# Patient Record
Sex: Male | Born: 1970 | Race: White | Hispanic: No | Marital: Married | Smoking: Never smoker
Health system: Southern US, Community
[De-identification: ages and names within clinical notes are randomized; demographics above are authoritative.]

## PROBLEM LIST (undated history)

## (undated) DIAGNOSIS — I10 Essential (primary) hypertension: Secondary | ICD-10-CM

## (undated) DIAGNOSIS — R519 Headache, unspecified: Secondary | ICD-10-CM

## (undated) DIAGNOSIS — E7800 Pure hypercholesterolemia, unspecified: Secondary | ICD-10-CM

## (undated) DIAGNOSIS — Z87442 Personal history of urinary calculi: Secondary | ICD-10-CM

## (undated) DIAGNOSIS — G4730 Sleep apnea, unspecified: Secondary | ICD-10-CM

---

## 2003-10-07 HISTORY — PX: EXTRACORPOREAL SHOCK WAVE LITHOTRIPSY: SHX1557

## 2016-10-06 HISTORY — PX: OTHER SURGICAL HISTORY: SHX169

## 2016-10-15 DIAGNOSIS — M5092 Unspecified cervical disc disorder, mid-cervical region, unspecified level: Secondary | ICD-10-CM | POA: Insufficient documentation

## 2016-10-15 DIAGNOSIS — M542 Cervicalgia: Secondary | ICD-10-CM | POA: Insufficient documentation

## 2016-12-17 DIAGNOSIS — E785 Hyperlipidemia, unspecified: Secondary | ICD-10-CM | POA: Insufficient documentation

## 2016-12-17 DIAGNOSIS — I10 Essential (primary) hypertension: Secondary | ICD-10-CM | POA: Insufficient documentation

## 2016-12-17 DIAGNOSIS — G4730 Sleep apnea, unspecified: Secondary | ICD-10-CM | POA: Insufficient documentation

## 2016-12-17 DIAGNOSIS — R0683 Snoring: Secondary | ICD-10-CM | POA: Insufficient documentation

## 2016-12-17 DIAGNOSIS — J309 Allergic rhinitis, unspecified: Secondary | ICD-10-CM | POA: Insufficient documentation

## 2017-02-04 DIAGNOSIS — R002 Palpitations: Secondary | ICD-10-CM | POA: Insufficient documentation

## 2017-03-05 DIAGNOSIS — G44209 Tension-type headache, unspecified, not intractable: Secondary | ICD-10-CM | POA: Insufficient documentation

## 2017-03-06 DIAGNOSIS — S22060S Wedge compression fracture of T7-T8 vertebra, sequela: Secondary | ICD-10-CM | POA: Insufficient documentation

## 2017-03-06 DIAGNOSIS — Z87442 Personal history of urinary calculi: Secondary | ICD-10-CM | POA: Insufficient documentation

## 2018-05-11 DIAGNOSIS — N529 Male erectile dysfunction, unspecified: Secondary | ICD-10-CM | POA: Insufficient documentation

## 2018-05-11 DIAGNOSIS — F321 Major depressive disorder, single episode, moderate: Secondary | ICD-10-CM | POA: Insufficient documentation

## 2018-05-11 DIAGNOSIS — Z87820 Personal history of traumatic brain injury: Secondary | ICD-10-CM | POA: Insufficient documentation

## 2020-03-16 ENCOUNTER — Emergency Department (HOSPITAL_COMMUNITY)

## 2020-03-16 ENCOUNTER — Emergency Department (HOSPITAL_COMMUNITY)
Admission: EM | Admit: 2020-03-16 | Discharge: 2020-03-16 | Disposition: A | Discharge status: Home / Self Care | Attending: Emergency Medicine | Admitting: Emergency Medicine

## 2020-03-16 ENCOUNTER — Encounter (HOSPITAL_COMMUNITY): Payer: Self-pay | Admitting: Emergency Medicine

## 2020-03-16 DIAGNOSIS — R1032 Left lower quadrant pain: Secondary | ICD-10-CM | POA: Diagnosis present

## 2020-03-16 DIAGNOSIS — I10 Essential (primary) hypertension: Secondary | ICD-10-CM | POA: Insufficient documentation

## 2020-03-16 DIAGNOSIS — N200 Calculus of kidney: Secondary | ICD-10-CM | POA: Diagnosis not present

## 2020-03-16 LAB — BASIC METABOLIC PANEL
Anion gap: 16 — ABNORMAL HIGH (ref 5–15)
BUN: 20 mg/dL (ref 6–20)
CO2: 19 mmol/L — ABNORMAL LOW (ref 22–32)
Calcium: 9.4 mg/dL (ref 8.9–10.3)
Chloride: 104 mmol/L (ref 98–111)
Creatinine, Ser: 1.18 mg/dL (ref 0.61–1.24)
GFR calc Af Amer: >60 mL/min (ref >60)
GFR calc non Af Amer: >60 mL/min (ref >60)
Glucose, Bld: 148 mg/dL — ABNORMAL HIGH (ref 70–99)
Potassium: 4.1 mmol/L (ref 3.5–5.1)
Sodium: 139 mmol/L (ref 135–145)

## 2020-03-16 LAB — URINALYSIS, ROUTINE W REFLEX MICROSCOPIC
Bacteria, UA: NONE SEEN
Bilirubin Urine: NEGATIVE
Glucose, UA: NEGATIVE mg/dL
Ketones, ur: 80 mg/dL — AB
Leukocytes,Ua: NEGATIVE
Nitrite: NEGATIVE
Protein, ur: 30 mg/dL — AB
Specific Gravity, Urine: 1.025 (ref 1.005–1.030)
pH: 5 (ref 5.0–8.0)

## 2020-03-16 LAB — CBC
HCT: 47.6 % (ref 39.0–52.0)
Hemoglobin: 15.6 g/dL (ref 13.0–17.0)
MCH: 30.5 pg (ref 26.0–34.0)
MCHC: 32.8 g/dL (ref 30.0–36.0)
MCV: 93 fL (ref 80.0–100.0)
Platelets: 242 10*3/uL (ref 150–400)
RBC: 5.12 MIL/uL (ref 4.22–5.81)
RDW: 12.7 % (ref 11.5–15.5)
WBC: 11.3 10*3/uL — ABNORMAL HIGH (ref 4.0–10.5)
nRBC: 0 % (ref 0.0–0.2)

## 2020-03-16 MED ORDER — KETOROLAC TROMETHAMINE 30 MG/ML IJ SOLN
30.0000 mg | Freq: Once | INTRAMUSCULAR | Status: AC
  Administered 2020-03-16: 30 mg via INTRAVENOUS
  Filled 2020-03-16: qty 1

## 2020-03-16 MED ORDER — HYDROCODONE-ACETAMINOPHEN 5-325 MG PO TABS: 2.0000 | ORAL_TABLET | ORAL | 0 refills | Status: AC | PRN

## 2020-03-16 NOTE — ED Provider Notes (Signed)
Patient received at shift change from [facility].  Please see his note for full HPI.  In short, patient presented with left-sided flank pain and frontal abdominal pain with a history of kidney stones.  UA with large blood, and mild leukocytosis of 11.3.  No lab abnormalities.  CT with 9 x 6 stone in the left kidney in the lower pole, with developing staghorn calculus with infundibular stricture, slough palpable in the setting of prior papillary necrosis or small neoplasm with calcification.  Consulted urology Dr. Diona Fanti, with whom I went over the imaging.  He recommends discharge with pain medication and follow-up in office.  On reevaluation of the patient, he is resting comfortably in the bed, and does not endorse any pain at this time.  Will discharge with short course of pain medication, and have him follow-up with Dr. Diona Fanti in office on Monday.  Return precautions given.  All the patient's questions have been answered to his satisfaction, he voices understanding and is agreeable to this plan.  At this stage in the ED course, the patient has been medically screened and is stable for discharge.  Physical Exam  BP 129/72 (BP Location: Right Arm)   Pulse 85   Temp (!) 97.4 F (36.3 C)   Resp 16   Ht 5\' 8"  (1.727 m)   Wt 95.3 kg   SpO2 98%   BMI 31.93 kg/m   Physical Exam Vitals reviewed.  Constitutional:      General: He is not in acute distress.    Appearance: Normal appearance. He is not ill-appearing, toxic-appearing or diaphoretic.  HENT:     Head: Normocephalic and atraumatic.     Mouth/Throat:     Mouth: Mucous membranes are moist.     Pharynx: Oropharynx is clear.  Eyes:     General:        Right eye: No discharge.        Left eye: No discharge.     Extraocular Movements: Extraocular movements intact.     Conjunctiva/sclera: Conjunctivae normal.  Cardiovascular:     Rate and Rhythm: Normal rate and regular rhythm.  Pulmonary:     Effort: Pulmonary effort is normal.      Breath sounds: Normal breath sounds.  Abdominal:     General: Abdomen is flat. There is no distension.     Tenderness: There is abdominal tenderness (Mild left lower quadrant tenderness). There is left CVA tenderness (Mild). There is no right CVA tenderness.  Musculoskeletal:        General: No swelling. Normal range of motion.     Cervical back: Normal range of motion.  Skin:    General: Skin is warm and dry.  Neurological:     General: No focal deficit present.     Mental Status: He is alert and oriented to person, place, and time.  Psychiatric:        Mood and Affect: Mood normal.        Behavior: Behavior normal.     ED Course/Procedures   Clinical Course as of Mar 16 1717  Fri Mar 16, 2020  1645 Hgb urine dipstick(!): LARGE [GG]    Clinical Course User Index [GG] Corena Herter, PA-C    Procedures  MDM        Garald Balding, PA-C 03/16/20 1718    Lajean Saver, MD 03/18/20 [phone]

## 2020-03-16 NOTE — Discharge Instructions (Addendum)
Please call Dr. Diona Fanti with [facility] on Monday to schedule an appointment.  Please take pain medication as needed for pain as prescribed.   Return to the ED or seek immediate medical attention should you experience any new or worsening symptoms.

## 2020-03-16 NOTE — ED Triage Notes (Signed)
Per pt, states left abdominal pain to flank area-started this am-states history of kidney stones-dysuria, nausea

## 2020-03-16 NOTE — ED Provider Notes (Signed)
[name] DEPT Provider Note   CSN: [id number] Arrival date & time: 03/16/20  1142     History Chief Complaint  Patient presents with  . Flank Pain    Donald Lang is a 49 y.o. male with PMH of HTN, migraine headaches, and kidney stones who presents to the ED with acute onset left-sided flank/abdominal pain that began this morning at approximately 8:30 AM.  Patient reports that he was having a normal morning until he had sudden onset of pain symptoms that progressed to 10 out of 10 discomfort.  While in triage, patient was in significant pain, however his symptoms seem to spontaneously abate.  On my examination, patient is resting comfortably in no acute distress.  He states that while the sudden onset of symptoms feels comparable to his prior kidney stones, his symptoms are more anterior and abdomen then in his flank as usual.  His symptoms this morning were accompanied by nausea and he states that he felt as though he needed to pass stool but could not.  He denies any recent illness, fevers or chills, chest pain or difficulty breathing, cough, vomiting, diminished appetite, hematuria, dysuria, or other symptoms.  HPI     Past Medical History:  Diagnosis Date  . Renal disorder     There are no problems to display for this patient.     No family history on file.  Social History   Tobacco Use  . Smoking status: Not on file  Substance Use Topics  . Alcohol use: Not on file  . Drug use: Not on file    Home Medications Prior to Admission medications   Medication Sig Start Date End Date Taking? Authorizing Provider  rizatriptan (MAXALT-MLT) 10 MG disintegrating tablet Take 10 mg by mouth daily as needed for migraine. May repeat in 2 hours if needed   Yes [provider]    Allergies    Patient has no known allergies.  Review of Systems   Review of Systems  All other systems reviewed and are negative.   Physical  Exam Updated Vital Signs BP 129/72 (BP Location: Right Arm)   Pulse 85   Temp (!) 97.4 F (36.3 C)   Resp 16   Ht 5\' 8"  (1.727 m)   Wt 95.3 kg   SpO2 98%   BMI 31.93 kg/m   Physical Exam Vitals and nursing note reviewed. Exam conducted with a chaperone present.  Constitutional:      Appearance: Normal appearance.  HENT:     Head: Normocephalic and atraumatic.  Eyes:     General: No scleral icterus.    Conjunctiva/sclera: Conjunctivae normal.  Cardiovascular:     Rate and Rhythm: Normal rate and regular rhythm.     Pulses: Normal pulses.     Heart sounds: Normal heart sounds.  Pulmonary:     Effort: Pulmonary effort is normal. No respiratory distress.     Breath sounds: Normal breath sounds.  Abdominal:     Comments: Soft, nondistended.  Mild TTP in area of LLQ, with no TTP elsewhere.  No suprapubic tenderness.  No left-sided CVAT.  No overlying skin changes.  Skin:    General: Skin is dry.     Capillary Refill: Capillary refill takes less than 2 seconds.  Neurological:     Mental Status: He is alert and oriented to person, place, and time.     GCS: GCS eye subscore is 4. GCS verbal subscore is 5. GCS motor subscore is  6.  Psychiatric:        Mood and Affect: Mood normal.        Behavior: Behavior normal.        Thought Content: Thought content normal.     ED Results / Procedures / Treatments   Labs (all labs ordered are listed, but only abnormal results are displayed) Labs Reviewed  URINALYSIS, ROUTINE W REFLEX MICROSCOPIC - Abnormal; Notable for the following components:      Result Value   Hgb urine dipstick LARGE (*)    Ketones, ur 80 (*)    Protein, ur 30 (*)    All other components within normal limits  BASIC METABOLIC PANEL - Abnormal; Notable for the following components:   CO2 19 (*)    Glucose, Bld 148 (*)    Anion gap 16 (*)    All other components within normal limits  CBC - Abnormal; Notable for the following components:   WBC 11.3 (*)    All  other components within normal limits    EKG None  Radiology CT Renal Stone Study  Result Date: 03/16/2020 CLINICAL DATA:  Flank pain, kidney stone suspected, LEFT lower quadrant discomfort EXAM: CT ABDOMEN AND PELVIS WITHOUT CONTRAST TECHNIQUE: Multidetector CT imaging of the abdomen and pelvis was performed following the standard protocol without IV contrast. COMPARISON:  None. FINDINGS: Lower chest: Calcification in the lingula compatible with granulomatous disease. No consolidation. No pleural effusion. Also with calcified granuloma in the LEFT lower lobe. Hepatobiliary: Hepatic steatosis. No suspicious focal hepatic lesion. No pericholecystic stranding. Pancreas: No peripancreatic stranding or ductal dilation. Spleen: Spleen normal size and contour. Adrenals/Urinary Tract: Adrenal glands are normal. Calcification on the LEFT in the lower pole infundibulum measuring 9 x 6 mm (image 39 of series 2. Question of focal lower pole caliceal dilation. Pattern of calcification is pyramidal and with mixed density. No ureteral calculus. Smooth renal contours. No hydronephrosis on the RIGHT. Urinary bladder is normal. Stomach/Bowel: No acute small bowel process. Stomach grossly normal. Appendix is normal. Foreshortening of the LEFT hemicolon, this raises the question of prior partial colectomy. No acute process related to the colon which is partially stool filled. Vascular/Lymphatic: Minimal atherosclerotic calcification. No aneurysmal dilation. No adenopathy in the upper abdomen. No pelvic lymphadenopathy. Reproductive: Prostate grossly normal by CT. Urinary bladder again unremarkable. Other: No free air.  No ascites. Musculoskeletal: No acute or destructive bone process. IMPRESSION: 1. Calcification on the LEFT in the lower pole infundibulum measuring 9 x 6 mm. Question of focal lower pole caliceal dilation. Pattern of calcification is pyramidal and with mixed density. Developing staghorn calculus with  infundibular stricture, calcified sloughed papilla in the setting of prior papillary necrosis or small urothelial neoplasm with calcification all considered as differential consideration given the isolated dilation of lower pole calyx and the pattern of calcification. Follow-up CT with hematuria protocol is suggested for further evaluation. 2. No ureteral calculus or hydronephrosis on the RIGHT. 3. Hepatic steatosis. 4. Foreshortening of the LEFT hemicolon. More medial course than usually seen this raises the question of prior partial colectomy. 5. Aortic atherosclerosis. Aortic Atherosclerosis (ICD10-I70.0). Electronically Signed   By: Zetta Bills M.D.   On: 03/16/2020 16:45    Procedures Procedures (including critical care time)  Medications Ordered in ED Medications  ketorolac (TORADOL) 30 MG/ML injection 30 mg (30 mg Intravenous Given 03/16/20 1424)    ED Course  I have reviewed the triage vital signs and the nursing notes.  Pertinent labs & imaging results  that were available during my care of the patient were reviewed by me and considered in my medical decision making (see chart for details).  Clinical Course as of Mar 17 1655  Fri Mar 16, 2020  1645 Hgb urine dipstick(!): LARGE [GG]    Clinical Course User Index [GG] Corena Herter, PA-C   MDM Rules/Calculators/A&P                          Given that patient was 10 on 10 pain triage is not resting comfortably on exam, it is possible that he has spontaneously passed a stone.  Will wait for labs to return prior to obtaining any imaging.  His abdominal exam was relatively benign and he does not appear to be in any acute distress.  Patient does inform me that he has been renovating his basement which has been physically demanding, however given his acute onset of symptoms while at rest, I have lower suspicion for musculoskeletal etiology as cause of his acute onset LLQ/flank discomfort.  Patient CBC is notable for mild  leukocytosis 11.3 and his BMP reflects a mildly elevated gap anion acidosis.  UA is notable for large hemoglobin, no evidence of infection.  Given this finding in conjunction with his HPI history of obstructing kidney stones, will obtain CT renal stone study.    On subsequent evaluation, patient is resting comfortably and is in no acute distress.  CT Renal Stone pending.  At shift change care was transferred to Sharyn Lull, PA-C who will follow pending studies, re-evaluate, and determine disposition.     Final Clinical Impression(s) / ED Diagnoses Final diagnoses:  Left flank pain    Rx / DC Orders ED Discharge Orders    None       Corena Herter, PA-C 03/16/20 1656    Lucrezia Starch, MD 03/31/20 [phone]

## 2020-03-28 ENCOUNTER — Other Ambulatory Visit: Payer: Self-pay | Admitting: Urology

## 2020-03-28 ENCOUNTER — Other Ambulatory Visit (HOSPITAL_COMMUNITY)

## 2020-04-05 ENCOUNTER — Encounter (HOSPITAL_BASED_OUTPATIENT_CLINIC_OR_DEPARTMENT_OTHER): Payer: Self-pay | Admitting: Urology

## 2020-04-05 ENCOUNTER — Other Ambulatory Visit: Payer: Self-pay

## 2020-04-05 NOTE — Progress Notes (Signed)
NEW Covid Policy July 0086  Surgery Day:  04-11-2020  Facility:  [facility]  Type of Surgery: left ureteroscopy  Fully Covid Vaccinated:   1) 01-04-2020                                          2)02-01-2020                                          Where? [facility] [place]                                           Type? [facility]   Do you have symptoms? no  In the past 14 days:no        Have you had any symptoms? no       Have you been tested covid positive?no       Have you been in contact with someone covid positive?no        Is pt Immuno-compromised? No Patient instructed to bring covid vaccination card day of surgery

## 2020-04-05 NOTE — Progress Notes (Signed)
Spoke w/ via phone for pre-op interview---pt Lab needs dos---- I stat 8, ekg  Patient states has htn no current meds bp runs 140/90  Per pt             COVID test ------not needed fully vaccinated Arrive at -------630 am 04-11-2020 No food after midnight clear liquids from midnight until 530 am then npo Medications to take morning of surgery -----none Diabetic medication -----n/a Patient Special Instructions -----bring cpap mask tubing and machine and leave in car Pre-Op special Istructions -----none Patient verbalized understanding of instructions that were given at this phone interview. Patient denies shortness of breath, chest pain, fever, cough a this phone interview.

## 2020-04-10 ENCOUNTER — Other Ambulatory Visit (HOSPITAL_COMMUNITY): Attending: Urology

## 2020-04-10 NOTE — Anesthesia Preprocedure Evaluation (Addendum)
Anesthesia Evaluation  Patient identified by MRN, date of birth, ID band Patient awake    Reviewed: Allergy & Precautions, NPO status , Patient's Chart, lab work & pertinent test results  History of Anesthesia Complications Negative for: history of anesthetic complications  Airway Mallampati: II  TM Distance: >3 FB Neck ROM: Full    Dental  (+) Dental Advisory Given, Teeth Intact   Pulmonary sleep apnea and Continuous Positive Airway Pressure Ventilation ,    Pulmonary exam normal        Cardiovascular hypertension (no meds), Normal cardiovascular exam     Neuro/Psych  Headaches, negative psych ROS   GI/Hepatic negative GI ROS, Neg liver ROS,   Endo/Other   Obesity   Renal/GU negative Renal ROS     Musculoskeletal negative musculoskeletal ROS (+)   Abdominal   Peds  Hematology negative hematology ROS (+)   Anesthesia Other Findings Fully vaccinated for Covid, not tested preop   Reproductive/Obstetrics                           Anesthesia Physical Anesthesia Plan  ASA: II  Anesthesia Plan: General   Post-op Pain Management:    Induction: Intravenous  PONV Risk Score and Plan: 2 and Treatment may vary due to age or medical condition, Ondansetron, Dexamethasone and Midazolam  Airway Management Planned: LMA  Additional Equipment: None  Intra-op Plan:   Post-operative Plan: Extubation in OR  Informed Consent: I have reviewed the patients History and Physical, chart, labs and discussed the procedure including the risks, benefits and alternatives for the proposed anesthesia with the patient or authorized representative who has indicated his/her understanding and acceptance.     Dental advisory given  Plan Discussed with: CRNA and Anesthesiologist  Anesthesia Plan Comments:        Anesthesia Quick Evaluation

## 2020-04-11 ENCOUNTER — Ambulatory Visit (HOSPITAL_BASED_OUTPATIENT_CLINIC_OR_DEPARTMENT_OTHER): Admitting: Anesthesiology

## 2020-04-11 ENCOUNTER — Ambulatory Visit (HOSPITAL_BASED_OUTPATIENT_CLINIC_OR_DEPARTMENT_OTHER)
Admission: RE | Admit: 2020-04-11 | Discharge: 2020-04-11 | Disposition: A | Discharge status: Home / Self Care | Attending: Urology | Admitting: Urology

## 2020-04-11 ENCOUNTER — Encounter (HOSPITAL_BASED_OUTPATIENT_CLINIC_OR_DEPARTMENT_OTHER): Payer: Self-pay | Admitting: Urology

## 2020-04-11 ENCOUNTER — Other Ambulatory Visit: Payer: Self-pay

## 2020-04-11 ENCOUNTER — Encounter (HOSPITAL_BASED_OUTPATIENT_CLINIC_OR_DEPARTMENT_OTHER)
Admission: RE | Disposition: A | Discharge status: Home / Self Care | Payer: Self-pay | Source: Home / Self Care | Attending: Urology

## 2020-04-11 DIAGNOSIS — N201 Calculus of ureter: Secondary | ICD-10-CM | POA: Insufficient documentation

## 2020-04-11 DIAGNOSIS — I10 Essential (primary) hypertension: Secondary | ICD-10-CM | POA: Diagnosis not present

## 2020-04-11 DIAGNOSIS — G4730 Sleep apnea, unspecified: Secondary | ICD-10-CM | POA: Insufficient documentation

## 2020-04-11 DIAGNOSIS — M1990 Unspecified osteoarthritis, unspecified site: Secondary | ICD-10-CM | POA: Diagnosis not present

## 2020-04-11 DIAGNOSIS — N200 Calculus of kidney: Secondary | ICD-10-CM

## 2020-04-11 DIAGNOSIS — E669 Obesity, unspecified: Secondary | ICD-10-CM | POA: Diagnosis not present

## 2020-04-11 DIAGNOSIS — Z6831 Body mass index (BMI) 31.0-31.9, adult: Secondary | ICD-10-CM | POA: Insufficient documentation

## 2020-04-11 HISTORY — DX: Headache, unspecified: R51.9

## 2020-04-11 HISTORY — DX: Essential (primary) hypertension: I10

## 2020-04-11 HISTORY — DX: Pure hypercholesterolemia, unspecified: E78.00

## 2020-04-11 HISTORY — DX: Personal history of urinary calculi: Z87.442

## 2020-04-11 HISTORY — PX: CYSTOSCOPY/URETEROSCOPY/HOLMIUM LASER/STENT PLACEMENT: SHX6546

## 2020-04-11 HISTORY — DX: Sleep apnea, unspecified: G47.30

## 2020-04-11 LAB — POCT I-STAT, CHEM 8
BUN: 20 mg/dL (ref 6–20)
Calcium, Ion: 1.3 mmol/L (ref 1.15–1.40)
Chloride: 104 mmol/L (ref 98–111)
Creatinine, Ser: 1 mg/dL (ref 0.61–1.24)
Glucose, Bld: 100 mg/dL — ABNORMAL HIGH (ref 70–99)
HCT: 41 % (ref 39.0–52.0)
Hemoglobin: 13.9 g/dL (ref 13.0–17.0)
Potassium: 4.2 mmol/L (ref 3.5–5.1)
Sodium: 142 mmol/L (ref 135–145)
TCO2: 28 mmol/L (ref 22–32)

## 2020-04-11 SURGERY — CYSTOSCOPY/URETEROSCOPY/HOLMIUM LASER/STENT PLACEMENT
Anesthesia: General | Site: Pelvis | Laterality: Left

## 2020-04-11 MED ORDER — OXYCODONE HCL 5 MG PO TABS: 5.0000 mg | ORAL_TABLET | Freq: Once | ORAL | Status: DC | PRN

## 2020-04-11 MED ORDER — CIPROFLOXACIN HCL 500 MG PO TABS
500.0000 mg | ORAL_TABLET | Freq: Once | ORAL | 0 refills | Status: AC
Start: 2020-04-11 — End: 2020-04-11

## 2020-04-11 MED ORDER — SODIUM CHLORIDE 0.9 % IR SOLN
Status: DC | PRN
  Administered 2020-04-11: 6000 mL

## 2020-04-11 MED ORDER — FENTANYL CITRATE (PF) 100 MCG/2ML IJ SOLN
INTRAMUSCULAR | Status: DC | PRN
  Administered 2020-04-11 (×3): 25 ug via INTRAVENOUS
  Administered 2020-04-11: 50 ug via INTRAVENOUS
  Administered 2020-04-11: 25 ug via INTRAVENOUS
  Administered 2020-04-11: 50 ug via INTRAVENOUS

## 2020-04-11 MED ORDER — LACTATED RINGERS IV SOLN: INTRAVENOUS | Status: DC

## 2020-04-11 MED ORDER — ONDANSETRON HCL 4 MG/2ML IJ SOLN
INTRAMUSCULAR | Status: AC
  Filled 2020-04-11: qty 2

## 2020-04-11 MED ORDER — FENTANYL CITRATE (PF) 100 MCG/2ML IJ SOLN
INTRAMUSCULAR | Status: AC
  Filled 2020-04-11: qty 2

## 2020-04-11 MED ORDER — CEFAZOLIN SODIUM-DEXTROSE 2-3 GM-%(50ML) IV SOLR
INTRAVENOUS | Status: DC | PRN
Start: 2020-04-11 — End: 2020-04-11
  Administered 2020-04-11: 2 g via INTRAVENOUS

## 2020-04-11 MED ORDER — KETOROLAC TROMETHAMINE 30 MG/ML IJ SOLN
30.0000 mg | Freq: Once | INTRAMUSCULAR | Status: AC
  Administered 2020-04-11: 30 mg via INTRAVENOUS

## 2020-04-11 MED ORDER — MIDAZOLAM HCL 2 MG/2ML IJ SOLN
INTRAMUSCULAR | Status: AC
  Filled 2020-04-11: qty 2

## 2020-04-11 MED ORDER — PROMETHAZINE HCL 25 MG/ML IJ SOLN: 6.2500 mg | INTRAMUSCULAR | Status: DC | PRN

## 2020-04-11 MED ORDER — PHENAZOPYRIDINE HCL 100 MG PO TABS
200.0000 mg | ORAL_TABLET | Freq: Once | ORAL | Status: AC
  Administered 2020-04-11: 200 mg via ORAL

## 2020-04-11 MED ORDER — FENTANYL CITRATE (PF) 100 MCG/2ML IJ SOLN
25.0000 ug | INTRAMUSCULAR | Status: DC | PRN
  Administered 2020-04-11: 25 ug via INTRAVENOUS

## 2020-04-11 MED ORDER — LIDOCAINE 2% (20 MG/ML) 5 ML SYRINGE
INTRAMUSCULAR | Status: AC
  Filled 2020-04-11: qty 5

## 2020-04-11 MED ORDER — LIDOCAINE HCL URETHRAL/MUCOSAL 2 % EX GEL
CUTANEOUS | Status: DC | PRN
  Administered 2020-04-11: 1 via URETHRAL

## 2020-04-11 MED ORDER — BELLADONNA ALKALOIDS-OPIUM 16.2-60 MG RE SUPP
RECTAL | Status: AC
  Filled 2020-04-11: qty 1

## 2020-04-11 MED ORDER — IOHEXOL 300 MG/ML  SOLN
INTRAMUSCULAR | Status: DC | PRN
  Administered 2020-04-11: 10 mL via URETHRAL

## 2020-04-11 MED ORDER — PHENAZOPYRIDINE HCL 200 MG PO TABS
200.0000 mg | ORAL_TABLET | Freq: Three times a day (TID) | ORAL | 0 refills | Status: DC | PRN
Start: 2020-04-11 — End: 2022-12-01

## 2020-04-11 MED ORDER — DEXAMETHASONE SODIUM PHOSPHATE 10 MG/ML IJ SOLN
INTRAMUSCULAR | Status: DC | PRN
  Administered 2020-04-11: 10 mg via INTRAVENOUS

## 2020-04-11 MED ORDER — LIDOCAINE 2% (20 MG/ML) 5 ML SYRINGE
INTRAMUSCULAR | Status: DC | PRN
  Administered 2020-04-11: 80 mg via INTRAVENOUS

## 2020-04-11 MED ORDER — PROPOFOL 10 MG/ML IV BOLUS
INTRAVENOUS | Status: DC | PRN
  Administered 2020-04-11: 200 mg via INTRAVENOUS

## 2020-04-11 MED ORDER — PHENAZOPYRIDINE HCL 100 MG PO TABS
ORAL_TABLET | ORAL | Status: AC
  Filled 2020-04-11: qty 2

## 2020-04-11 MED ORDER — ONDANSETRON HCL 4 MG/2ML IJ SOLN
INTRAMUSCULAR | Status: DC | PRN
  Administered 2020-04-11: 4 mg via INTRAVENOUS

## 2020-04-11 MED ORDER — CEFAZOLIN SODIUM 1 G IJ SOLR
INTRAMUSCULAR | Status: AC
  Filled 2020-04-11: qty 20

## 2020-04-11 MED ORDER — BELLADONNA ALKALOIDS-OPIUM 16.2-60 MG RE SUPP
RECTAL | Status: DC | PRN
  Administered 2020-04-11: 1 via RECTAL

## 2020-04-11 MED ORDER — DEXAMETHASONE SODIUM PHOSPHATE 10 MG/ML IJ SOLN
INTRAMUSCULAR | Status: AC
  Filled 2020-04-11: qty 1

## 2020-04-11 MED ORDER — MIDAZOLAM HCL 5 MG/5ML IJ SOLN
INTRAMUSCULAR | Status: DC | PRN
  Administered 2020-04-11: 2 mg via INTRAVENOUS

## 2020-04-11 MED ORDER — PROPOFOL 500 MG/50ML IV EMUL
INTRAVENOUS | Status: AC
  Filled 2020-04-11: qty 50

## 2020-04-11 MED ORDER — KETOROLAC TROMETHAMINE 30 MG/ML IJ SOLN
INTRAMUSCULAR | Status: AC
  Filled 2020-04-11: qty 1

## 2020-04-11 MED ORDER — OXYCODONE HCL 5 MG/5ML PO SOLN: 5.0000 mg | Freq: Once | ORAL | Status: DC | PRN

## 2020-04-11 SURGICAL SUPPLY — 22 items
BAG DRAIN URO-CYSTO SKYTR STRL (DRAIN) ×3 IMPLANT
BASKET LASER NITINOL 1.9FR (BASKET) IMPLANT
BENZOIN TINCTURE PRP APPL 2/3 (GAUZE/BANDAGES/DRESSINGS) ×3 IMPLANT
CATH URET 5FR 28IN OPEN ENDED (CATHETERS) ×3 IMPLANT
CATH URET DUAL LUMEN 6-10FR 50 (CATHETERS) IMPLANT
CLOTH BEACON ORANGE TIMEOUT ST (SAFETY) ×3 IMPLANT
DRSG TEGADERM 4X4.75 (GAUZE/BANDAGES/DRESSINGS) ×3 IMPLANT
EXTRACTOR STONE 1.7FRX115CM (UROLOGICAL SUPPLIES) ×6 IMPLANT
FIBER LASER TRAC TIP (UROLOGICAL SUPPLIES) ×3 IMPLANT
GLOVE BIO SURGEON STRL SZ7.5 (GLOVE) ×3 IMPLANT
GOWN STRL REUS W/TWL XL LVL3 (GOWN DISPOSABLE) ×3 IMPLANT
GUIDEWIRE STR DUAL SENSOR (WIRE) ×6 IMPLANT
IV NS IRRIG 3000ML ARTHROMATIC (IV SOLUTION) ×6 IMPLANT
KIT TURNOVER CYSTO (KITS) ×3 IMPLANT
MANIFOLD NEPTUNE II (INSTRUMENTS) ×3 IMPLANT
NS IRRIG 500ML POUR BTL (IV SOLUTION) ×3 IMPLANT
PACK CYSTO (CUSTOM PROCEDURE TRAY) ×3 IMPLANT
SHEATH URETERAL 12FRX35CM (MISCELLANEOUS) ×3 IMPLANT
STENT URET 6FRX26 CONTOUR (STENTS) ×3 IMPLANT
TUBE CONNECTING 12'X1/4 (SUCTIONS) ×1
TUBE CONNECTING 12X1/4 (SUCTIONS) ×2 IMPLANT
TUBING UROLOGY SET (TUBING) ×3 IMPLANT

## 2020-04-11 NOTE — Discharge Instructions (Signed)
  Post Anesthesia Home Care Instructions  Activity: Get plenty of rest for the remainder of the day. A responsible adult should stay with you for 24 hours following the procedure.  For the next 24 hours, DO NOT: -Drive a car -Paediatric nurse -Drink alcoholic beverages -Take any medication unless instructed by your physician -Make any legal decisions or sign important papers.  Meals: Start with liquid foods such as gelatin or soup. Progress to regular foods as tolerated. Avoid greasy, spicy, heavy foods. If nausea and/or vomiting occur, drink only clear liquids until the nausea and/or vomiting subsides. Call your physician if vomiting continues.  Special Instructions/Symptoms: Your throat may feel dry or sore from the anesthesia or the breathing tube placed in your throat during surgery. If this causes discomfort, gargle with warm salt water. The discomfort should disappear within 24 hours.  If you had a scopolamine patch placed behind your ear for the management of post- operative nausea and/or vomiting:  1. The medication in the patch is effective for 72 hours, after which it should be removed.  Wrap patch in a tissue and discard in the trash. Wash hands thoroughly with soap and water. 2. You may remove the patch earlier than 72 hours if you experience unpleasant side effects which may include dry mouth, dizziness or visual disturbances. 3. Avoid touching the patch. Wash your hands with soap and water after contact with the patch.    DISCHARGE INSTRUCTIONS FOR KIDNEY STONE/URETERAL STENT   MEDICATIONS:  1. Resume all your other meds from home - except do not take any extra narcotic pain meds that you may have at home.  2. Pyridium is to help with the burning/stinging when you urinate. 3. Tramadol is for moderate/severe pain, otherwise taking upto 1000 mg every 6 hours of plainTylenol will help treat your pain.   4. Take Cipro one hour prior to removal of your stent.   ACTIVITY:    1. No strenuous activity x 1week  2. No driving while on narcotic pain medications  3. Drink plenty of water  4. Continue to walk at home - you can still get blood clots when you are at home, so keep active, but don't over do it.  5. May return to work/school tomorrow or when you feel ready   BATHING:  1. You can shower and we recommend daily showers  2. You have a string coming from your urethra: The stent string is attached to your ureteral stent. Do not pull on this.   SIGNS/SYMPTOMS TO CALL:  Please call us if you have a fever greater than 101.5, uncontrolled nausea/vomiting, uncontrolled pain, dizziness, unable to urinate, bloody urine, chest pain, shortness of breath, leg swelling, leg pain, redness around wound, drainage from wound, or any other concerns or questions.   You can reach [place] at [phone].   FOLLOW-UP:  1. You have an appointment in 6 weeks with a ultrasound of your kidneys prior.  2. You have a string attached to your stent, you may remove it on Monday, July 12. To do this, pull the strings until the stents are completely removed. You may feel an odd sensation in your back.

## 2020-04-11 NOTE — Transfer of Care (Signed)
Immediate Anesthesia Transfer of Care Note  Patient: Donald Lang  Procedure(s) Performed: LEFT URETEROSCOPY/HOLMIUM LASER STONE EXTRACTION STENT PLACEMENT (Left Pelvis)  Patient Location: PACU  Anesthesia Type:General  Level of Consciousness: drowsy and patient cooperative  Airway & Oxygen Therapy: Patient Spontanous Breathing and Patient connected to face mask oxygen  Post-op Assessment: Report given to RN and Post -op Vital signs reviewed and stable  Post vital signs: Reviewed and stable  Last Vitals:  Vitals Value Taken Time  BP    Temp    Pulse 74 04/11/20 1014  Resp    SpO2 100 % 04/11/20 1014  Vitals shown include unvalidated device data.  Last Pain:  Vitals:   04/11/20 0709  TempSrc: Oral  PainSc: 0-No pain      Patients Stated Pain Goal: 4 (53/97/67 [id number])  Complications: No complications documented.

## 2020-04-11 NOTE — H&P (Signed)
49 year old male presents today for follow-up from the emergency department where he was seen for left-sided renal colic on June 35WS. The patient underwent a CT scan which demonstrates 10 mm stone in the left lower pole.   The patient has had a constant left upper quadrant deep pain which has intermittent flares. He was doing well on Tylenol and ibuprofen over the course of the last several weeks, but yesterday had intense left-sided pain requiring Vicodin. He denies any fevers or chills. He denies any pain radiating to this flank or to his left hemiscrotum. He denies any hematuria or dysuria.   The patient has a history of stones requiring lithotripsy.     ALLERGIES: None   MEDICATIONS: Acetaminophen 500 mg tablet  Hydrocodone-Acetaminophen 5 mg-325 mg tablet  Ibuprofen 800 mg tablet  Maxalt 10 mg tablet     GU PSH: None     PSH Notes: Spinal discectomy with replacement C5-6   NON-GU PSH: None   GU PMH: Urinary Calculus, Unspec    NON-GU PMH: Anxiety Arthritis Depression GERD Hypercholesterolemia Hypertension Sleep Apnea    FAMILY HISTORY: Diabetes - Father Kidney Stones - Father   SOCIAL HISTORY: Marital Status: Married Preferred Language: English; Ethnicity: Not Hispanic Or Latino; Race: White Current Smoking Status: Patient has never smoked.   Tobacco Use Assessment Completed: Used Tobacco in last 30 days? Does not use smokeless tobacco. Does drink.  Does not use drugs. Drinks 2 caffeinated drinks per day.    REVIEW OF SYSTEMS:    GU Review Male:   Patient denies frequent urination, hard to postpone urination, burning/ pain with urination, get up at night to urinate, leakage of urine, stream starts and stops, trouble starting your stream, have to strain to urinate , erection problems, and penile pain.  Gastrointestinal (Upper):   Patient reports indigestion/ heartburn. Patient denies nausea and vomiting.  Gastrointestinal (Lower):   Patient denies diarrhea and  constipation.  Constitutional:   Patient reports fatigue. Patient denies fever, night sweats, and weight loss.  Skin:   Patient denies skin rash/ lesion and itching.  Eyes:   Patient denies blurred vision and double vision.  Ears/ Nose/ Throat:   Patient reports sinus problems. Patient denies sore throat.  Hematologic/Lymphatic:   Patient denies swollen glands and easy bruising.  Cardiovascular:   Patient denies leg swelling and chest pains.  Respiratory:   Patient denies cough and shortness of breath.  Endocrine:   Patient denies excessive thirst.  Musculoskeletal:   Patient reports back pain and joint pain.   Neurological:   Patient reports headaches. Patient denies dizziness.  Psychologic:   Patient reports depression and anxiety.    VITAL SIGNS:      03/27/2020 08:08 AM  Weight 210 lb / 95.25 kg  Height 68 in / 172.72 cm  BP 108/68 mmHg  Pulse 79 /min  Temperature 99.3 F / 37.3 C  BMI 31.9 kg/m   MULTI-SYSTEM PHYSICAL EXAMINATION:    Constitutional: Well-nourished. No physical deformities. Normally developed. Good grooming.  Neck: Neck symmetrical, not swollen. Normal tracheal position.  Respiratory: Normal breath sounds. No labored breathing, no use of accessory muscles.   Cardiovascular: Regular rate and rhythm. No murmur, no gallop. Normal temperature, normal extremity pulses, no swelling, no varicosities.   Lymphatic: No enlargement of neck, axillae, groin.  Skin: No paleness, no jaundice, no cyanosis. No lesion, no ulcer, no rash.  Neurologic / Psychiatric: Oriented to time, oriented to place, oriented to person. No depression, no anxiety, no  agitation.  Gastrointestinal: The patient's abdomen is soft, nontender the the the the sided palpation. There is no CVA tenderness   Eyes: Normal conjunctivae. Normal eyelids.  Ears, Nose, Mouth, and Throat: Left ear no scars, no lesions, no masses. Right ear no scars, no lesions, no masses. Nose no scars, no lesions, no masses. Normal  hearing. Normal lips.  Musculoskeletal: Normal gait and station of head and neck.     Complexity of Data:  Records Review:   Previous Patient Records, POC Tool  Urine Test Review:   Urinalysis  X-Ray Review: C.T. Abdomen/Pelvis: Reviewed Films. Discussed With Patient.     PROCEDURES:          Urinalysis w/Scope Dipstick Dipstick Cont'd Micro  Color: Amber Bilirubin: Neg mg/dL WBC/hpf: NS (Not Seen)  Appearance: Cloudy Ketones: Neg mg/dL RBC/hpf: >60/hpf  Specific Gravity: >1.030 Blood: 3+ ery/uL Bacteria: Few (10-25/hpf)  pH: 6.0 Protein: 2+ mg/dL Cystals: Amorph Urates  Glucose: Neg mg/dL Urobilinogen: 0.2 mg/dL Casts: NS (Not Seen)    Nitrites: Neg Trichomonas: Not Present    Leukocyte Esterase: Neg leu/uL Mucous: Present      Epithelial Cells: NS (Not Seen)      Yeast: NS (Not Seen)      Sperm: Not Present    ASSESSMENT:      ICD-10 Details  1 GU:   Urinary Calculus, Unspec - N20.9   2   LUQ pain - R10.12    PLAN:            Medications New Meds: Ultram 50 mg tablet 1-2 tablet PO Q 6 H PRN   #45  0 Refill(s)  Zofran 4 mg tablet 1 tablet PO Q 4 H PRN   #20  1 Refill(s)            Schedule Return Visit/Planned Activity: ASAP - Schedule Surgery          Document Letter(s):  Created for Patient: Clinical Summary         Notes:   I reviewed the images with the patient I discussed the treatment options. It appears that he has his stone in the left lower pole with some fullness in the lower pole calyx. This may just be an obstructing stone with hydronephrosis. However, it is somewhat more suspicious and there may actually be some tissue behind the stone. As such I did not recommend that we proceed with shockwave lithotripsy, but instead recommended that we perform ureteroscopy for better evaluation of this area and stone removal simultaneously.   Further complicating this, as the patient's pain is atypical, am not completely convinced that the stone is related to his  pain. However, I think it is worth getting this off the table.

## 2020-04-11 NOTE — Anesthesia Postprocedure Evaluation (Signed)
Anesthesia Post Note  Patient: Donald Lang  Procedure(s) Performed: LEFT URETEROSCOPY/HOLMIUM LASER STONE EXTRACTION STENT PLACEMENT (Left Pelvis)     Patient location during evaluation: PACU Anesthesia Type: General Level of consciousness: awake and alert Pain management: pain level controlled Vital Signs Assessment: post-procedure vital signs reviewed and stable Respiratory status: spontaneous breathing, nonlabored ventilation and respiratory function stable Cardiovascular status: blood pressure returned to baseline and stable Postop Assessment: no apparent nausea or vomiting Anesthetic complications: no   No complications documented.  Last Vitals:  Vitals:   04/11/20 1100 04/11/20 1210  BP: (!) 141/74 131/77  Pulse: 63 67  Resp: 13 16  Temp:  36.5 C  SpO2: 100% 100%    Last Pain:  Vitals:   04/11/20 1210  TempSrc:   PainSc: [place]

## 2020-04-11 NOTE — Anesthesia Procedure Notes (Signed)
Procedure Name: LMA Insertion Date/Time: 04/11/2020 8:36 AM Performed by: Genelle Bal, CRNA Pre-anesthesia Checklist: Patient identified, Emergency Drugs available, Suction available and Patient being monitored Patient Re-evaluated:Patient Re-evaluated prior to induction Oxygen Delivery Method: Circle system utilized Preoxygenation: Pre-oxygenation with 100% oxygen Induction Type: IV induction Ventilation: Mask ventilation without difficulty LMA: LMA inserted LMA Size: 4.0 Number of attempts: 1 Airway Equipment and Method: Bite block Placement Confirmation: positive ETCO2 Tube secured with: Tape Dental Injury: Teeth and Oropharynx as per pre-operative assessment

## 2020-04-11 NOTE — Interval H&P Note (Signed)
History and Physical Interval Note:  04/11/2020 8:14 AM  Donald Lang  has presented today for surgery, with the diagnosis of LEFT KIDNEY STONE.  The various methods of treatment have been discussed with the patient and family. After consideration of risks, benefits and other options for treatment, the patient has consented to  Procedure(s): LEFT URETEROSCOPY/HOLMIUM LASER STONE EXTRACTION STENT PLACEMENT (Left) as a surgical intervention.  The patient's history has been reviewed, patient examined, no change in status, stable for surgery.  I have reviewed the patient's chart and labs.  Questions were answered to the patient's satisfaction.     Ardis Hughs

## 2020-04-11 NOTE — Op Note (Signed)
Preoperative diagnosis: left ureteral calculus  Postoperative diagnosis: left ureteral calculus  Procedure:  1. Cystoscopy 2. left ureteroscopy and stone removal 3. Ureteroscopic laser lithotripsy 4. left 1F x 26 ureteral stent placement  5. left retrograde pyelography with interpretation  Surgeon: Ardis Hughs, MD  Anesthesia: General  Complications: None  Intraoperative findings: left retrograde pyelography demonstrated a filling defect within the left ureter consistent with the patient's known calculus without other abnormalities.  EBL: Minimal  Specimens: 1. left ureteral calculus  Disposition of specimens: [facility] for stone analysis  Indication: Donald Lang is a 49 y.o.   patient with a left ureteral stone and associated left symptoms. After reviewing the management options for treatment, the patient elected to proceed with the above surgical procedure(s). We have discussed the potential benefits and risks of the procedure, side effects of the proposed treatment, the likelihood of the patient achieving the goals of the procedure, and any potential problems that might occur during the procedure or recuperation. Informed consent has been obtained.   Description of procedure:  The patient was taken to the operating room and general anesthesia was induced.  The patient was placed in the dorsal lithotomy position, prepped and draped in the usual sterile fashion, and preoperative antibiotics were administered. A preoperative time-out was performed.   Cystourethroscopy was performed.  The patient's urethra was examined and was normal and demonstrated bilobar prostatic hypertrophy.  The bladder was then systematically examined in its entirety. There was no evidence for any bladder tumors, stones, or other mucosal pathology.    Attention then turned to the left ureteral orifice and a ureteral catheter was used to intubate the ureteral orifice.   Omnipaque contrast was injected through the ureteral catheter and a retrograde pyelogram was performed with findings as dictated above.  A 0.38 sensor guidewire was then advanced up the left ureter into the renal pelvis under fluoroscopic guidance. The 6 Fr semirigid ureteroscope was then advanced into the ureter next to the guidewire and the calculus was identified.   The stone was then fragmented with the 365 micron holmium laser fiber on a setting of 1.0 and frequency of 10 Hz.   All stones were then removed from the ureter with an N-gage nitinol basket.  Reinspection of the ureter revealed no remaining visible stones or fragments.   The wire was then backloaded through the cystoscope and a ureteral stent was advance over the wire using Seldinger technique.  The stent was positioned appropriately under fluoroscopic and cystoscopic guidance.  The wire was then removed with an adequate stent curl noted in the renal pelvis as well as in the bladder.  The bladder was then emptied and the procedure ended.  The patient appeared to tolerate the procedure well and without complications.  The patient was able to be awakened and transferred to the recovery unit in satisfactory condition.   Disposition: The tether of the stent was left on and secured to the ventral aspect of the patient's penis. Instructions for removing the stent have been provided to the patient. The patient has been scheduled for followup in 6 weeks with a renal ultrasound.

## 2020-04-12 ENCOUNTER — Encounter (HOSPITAL_BASED_OUTPATIENT_CLINIC_OR_DEPARTMENT_OTHER): Payer: Self-pay | Admitting: Urology

## 2020-10-31 IMAGING — CT CT RENAL STONE PROTOCOL
2 of 4 series · 16 of 46 positions shown, 18 images · non-contrast
Comparison: None.

CLINICAL DATA: Flank pain, kidney stone suspected, LEFT lower
quadrant discomfort

EXAM:
CT ABDOMEN AND PELVIS WITHOUT CONTRAST
TECHNIQUE: Multidetector CT imaging of the abdomen and pelvis was performed
following the standard protocol without IV contrast.

[Series 2: axial st · axial · 0.82mm/px · z∈[+980,+1400]mm · 13 of 96 slices shown, 15 images]
[im 6/96  soft-tissue]
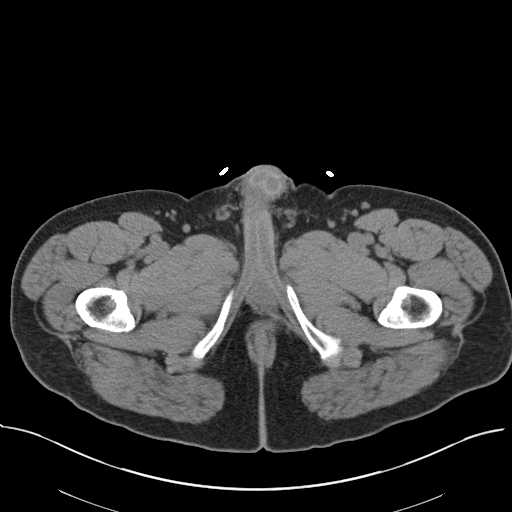
[im 6/96  bone]
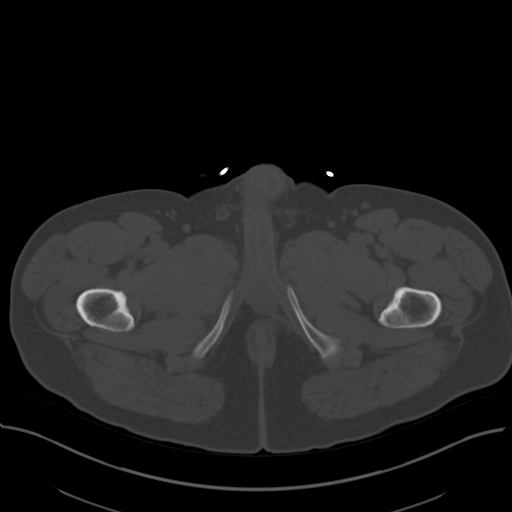
[im 12/96  soft-tissue]
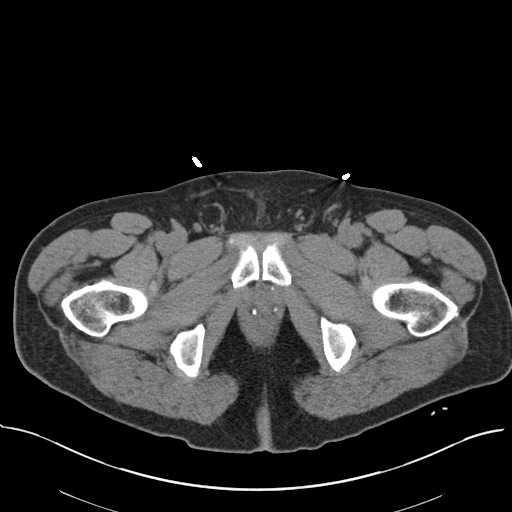
[im 23/96  soft-tissue]
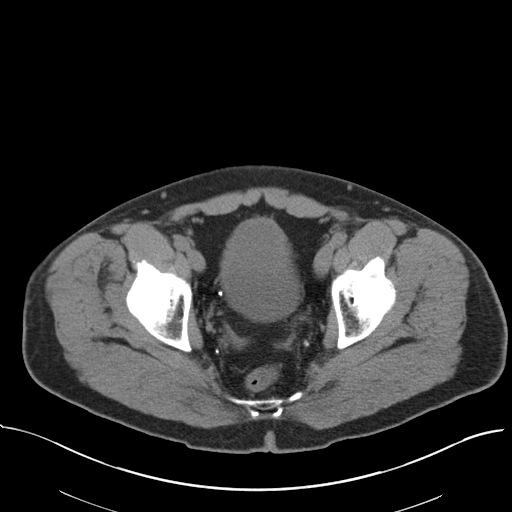
[im 28/96  soft-tissue]
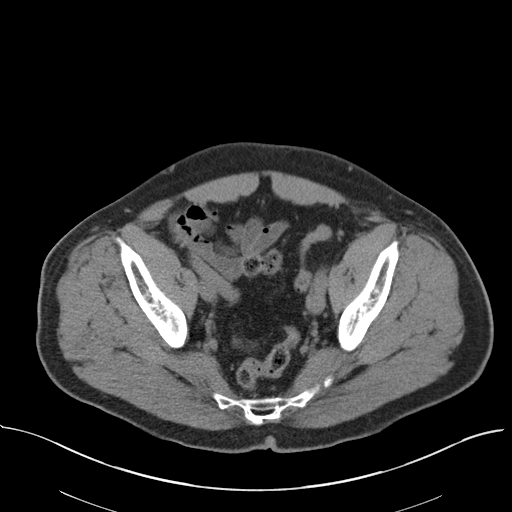
[im 34/96  soft-tissue]
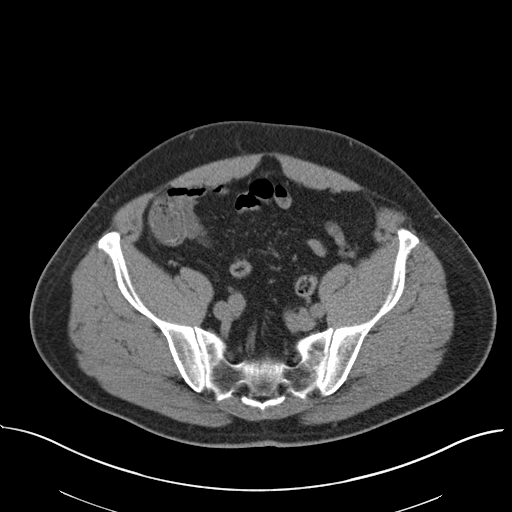
[im 40/96  soft-tissue]
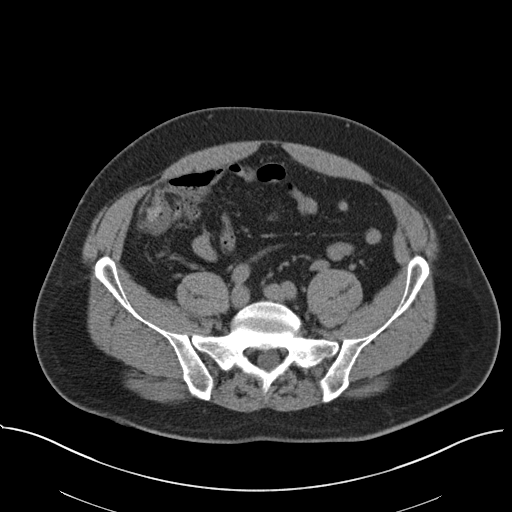
[im 51/96  soft-tissue]
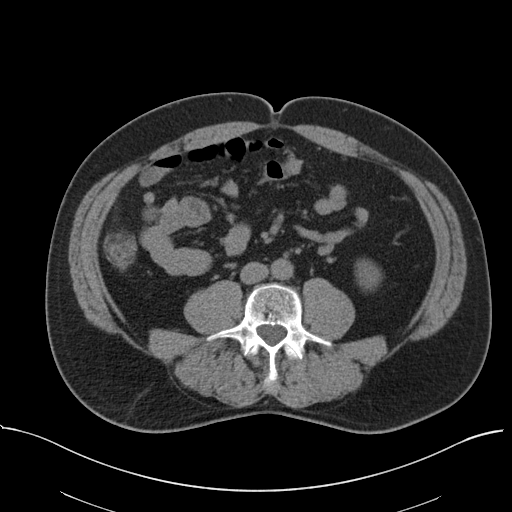
[im 56/96  soft-tissue]
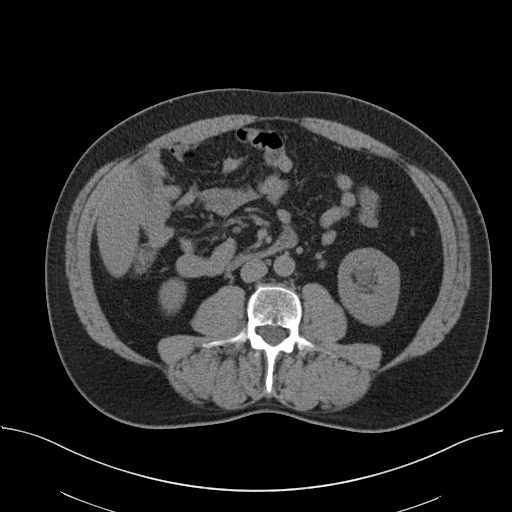
[im 62/96  soft-tissue]
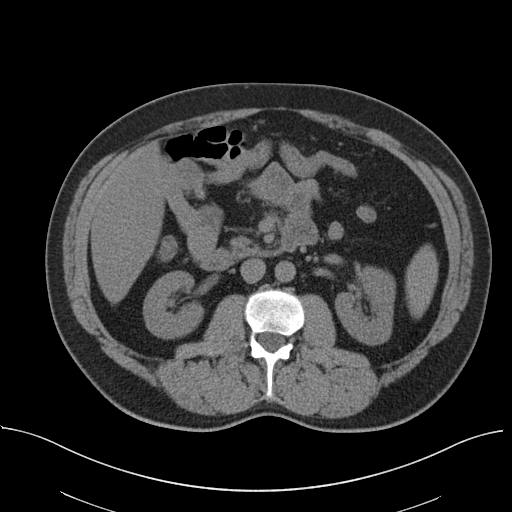
[im 62/96  bone]
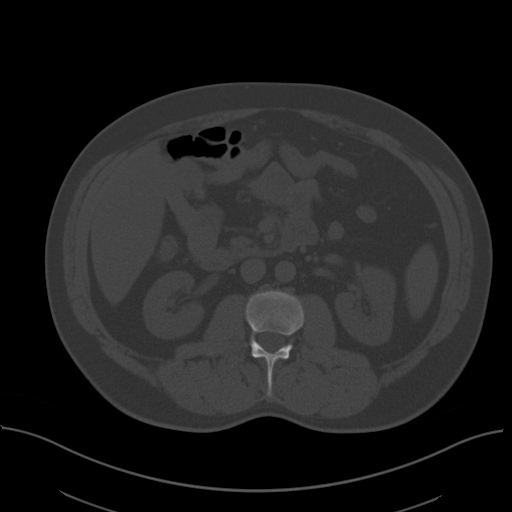
[im 68/96  soft-tissue]
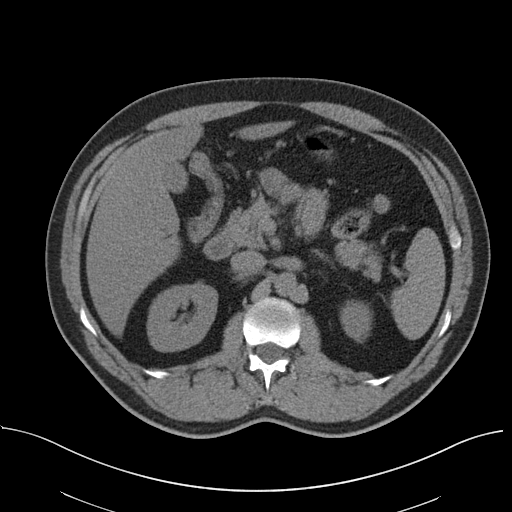
[im 73/96  soft-tissue]
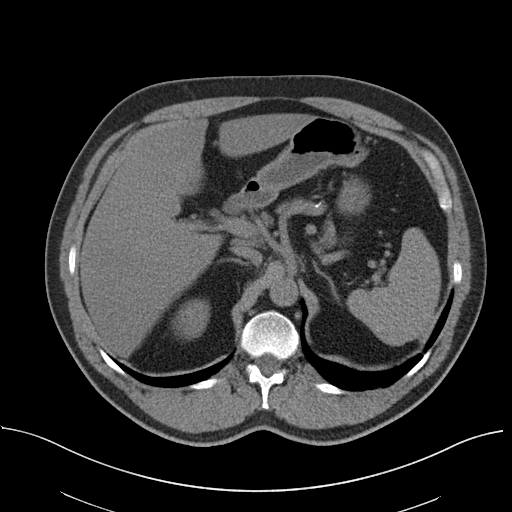
[im 84/96  soft-tissue]
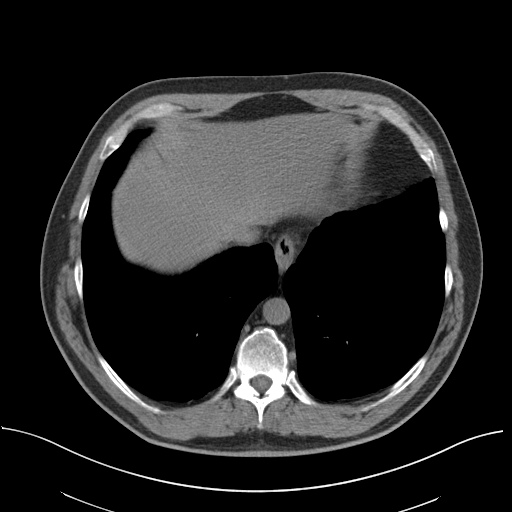
[im 90/96  soft-tissue]
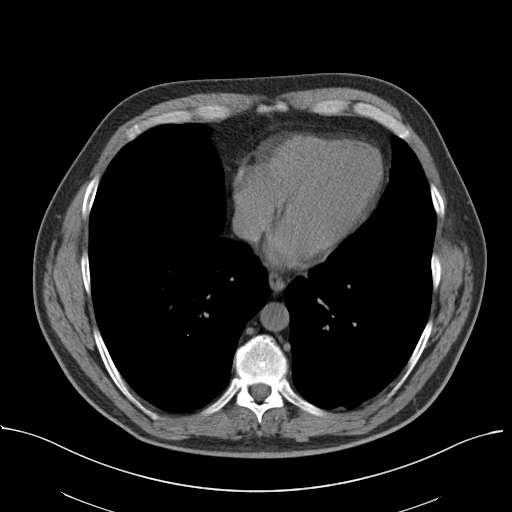

[Series 4: coronal · coronal · 0.80mm/px · 3 of 151 slices shown]
[im 51/151  soft-tissue]
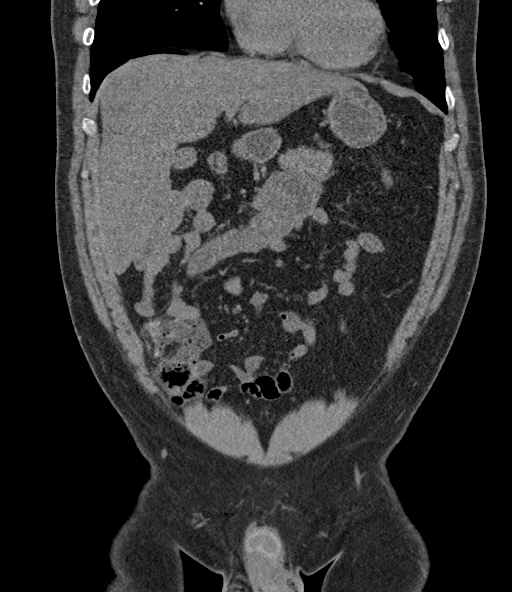
[im 67/151  soft-tissue]
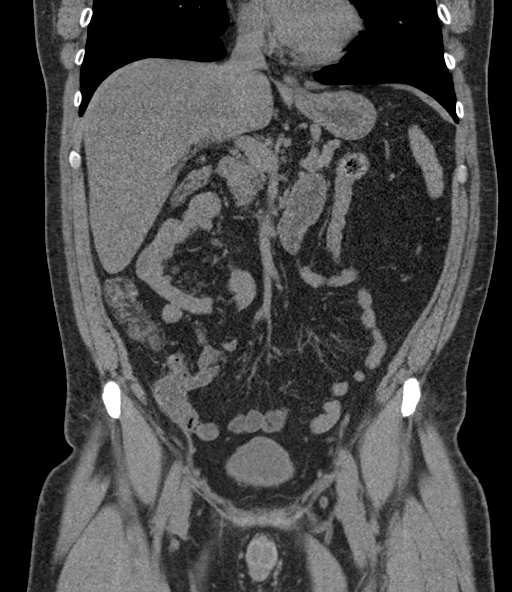
[im 84/151  soft-tissue]
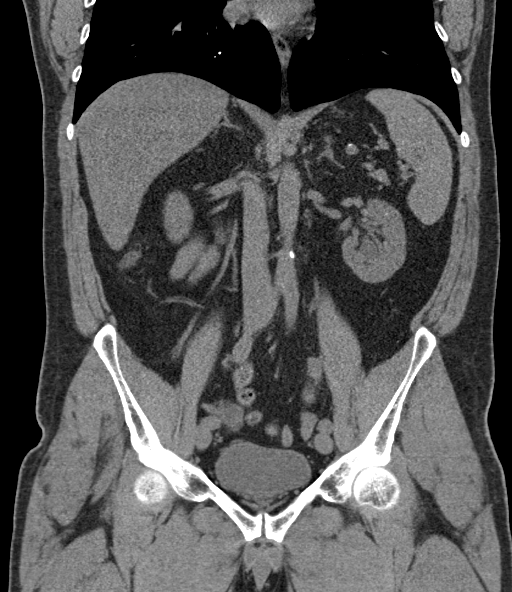

[16 of 46 positions shown; findings below may reference images not displayed]

FINDINGS: Lower chest: Calcification in the lingula compatible with
granulomatous disease. No consolidation. No pleural effusion. Also
with calcified granuloma in the LEFT lower lobe.

Hepatobiliary: Hepatic steatosis. No suspicious focal hepatic
lesion. No pericholecystic stranding.

Pancreas: No peripancreatic stranding or ductal dilation.

Spleen: Spleen normal size and contour.

Adrenals/Urinary Tract: Adrenal glands are normal.

Calcification on the LEFT in the lower pole infundibulum measuring 9
x 6 mm (image 39 of series 2. Question of focal lower pole caliceal
dilation. Pattern of calcification is pyramidal and with mixed
density.

No ureteral calculus. Smooth renal contours. No hydronephrosis on
the RIGHT. Urinary bladder is normal.

Stomach/Bowel: No acute small bowel process. Stomach grossly normal.
Appendix is normal. Foreshortening of the LEFT hemicolon, this
raises the question of prior partial colectomy. No acute process
related to the colon which is partially stool filled.

Vascular/Lymphatic: Minimal atherosclerotic calcification. No
aneurysmal dilation. No adenopathy in the upper abdomen.

No pelvic lymphadenopathy.

Reproductive: Prostate grossly normal by CT. Urinary bladder again
unremarkable.

Other: No free air.  No ascites.

Musculoskeletal: No acute or destructive bone process.
IMPRESSION: 1. Calcification on the LEFT in the lower pole infundibulum
measuring 9 x 6 mm. Question of focal lower pole caliceal dilation.
Pattern of calcification is pyramidal and with mixed density.
Developing staghorn calculus with infundibular stricture, calcified
sloughed papilla in the setting of prior papillary necrosis or small
urothelial neoplasm with calcification all considered as
differential consideration given the isolated dilation of lower pole
calyx and the pattern of calcification. Follow-up CT with hematuria
protocol is suggested for further evaluation.
2. No ureteral calculus or hydronephrosis on the RIGHT.
3. Hepatic steatosis.
4. Foreshortening of the LEFT hemicolon. More medial course than
usually seen this raises the question of prior partial colectomy.
5. Aortic atherosclerosis.

Aortic Atherosclerosis ([id number]).

## 2022-11-09 ENCOUNTER — Emergency Department (HOSPITAL_COMMUNITY)
Admission: EM | Admit: 2022-11-09 | Discharge: 2022-11-09 | Disposition: A | Discharge status: Home / Self Care | Attending: Emergency Medicine | Admitting: Emergency Medicine

## 2022-11-09 ENCOUNTER — Emergency Department (HOSPITAL_COMMUNITY)

## 2022-11-09 ENCOUNTER — Other Ambulatory Visit: Payer: Self-pay

## 2022-11-09 ENCOUNTER — Encounter (HOSPITAL_COMMUNITY): Payer: Self-pay

## 2022-11-09 DIAGNOSIS — R079 Chest pain, unspecified: Secondary | ICD-10-CM | POA: Diagnosis present

## 2022-11-09 DIAGNOSIS — I10 Essential (primary) hypertension: Secondary | ICD-10-CM | POA: Insufficient documentation

## 2022-11-09 DIAGNOSIS — E7800 Pure hypercholesterolemia, unspecified: Secondary | ICD-10-CM | POA: Diagnosis not present

## 2022-11-09 LAB — CBC
HCT: 44 % (ref 39.0–52.0)
Hemoglobin: 14.9 g/dL (ref 13.0–17.0)
MCH: 31.2 pg (ref 26.0–34.0)
MCHC: 33.9 g/dL (ref 30.0–36.0)
MCV: 92.1 fL (ref 80.0–100.0)
Platelets: 205 10*3/uL (ref 150–400)
RBC: 4.78 MIL/uL (ref 4.22–5.81)
RDW: 12.5 % (ref 11.5–15.5)
WBC: 11.3 10*3/uL — ABNORMAL HIGH (ref 4.0–10.5)
nRBC: 0 % (ref 0.0–0.2)

## 2022-11-09 LAB — BASIC METABOLIC PANEL
Anion gap: 11 (ref 5–15)
BUN: 21 mg/dL — ABNORMAL HIGH (ref 6–20)
CO2: 23 mmol/L (ref 22–32)
Calcium: 9.1 mg/dL (ref 8.9–10.3)
Chloride: 104 mmol/L (ref 98–111)
Creatinine, Ser: 0.98 mg/dL (ref 0.61–1.24)
GFR, Estimated: >60 mL/min (ref >60)
Glucose, Bld: 93 mg/dL (ref 70–99)
Potassium: 3.7 mmol/L (ref 3.5–5.1)
Sodium: 138 mmol/L (ref 135–145)

## 2022-11-09 LAB — LIPID PANEL
Cholesterol: 275 mg/dL — ABNORMAL HIGH (ref 0–200)
HDL: 39 mg/dL — ABNORMAL LOW (ref >40)
LDL Cholesterol: 210 mg/dL — ABNORMAL HIGH (ref 0–99)
Total CHOL/HDL Ratio: 7.1 RATIO
Triglycerides: 131 mg/dL (ref <150)
VLDL: 26 mg/dL (ref 0–40)

## 2022-11-09 LAB — TROPONIN I (HIGH SENSITIVITY)
Troponin I (High Sensitivity): 4 ng/L (ref <18)
Troponin I (High Sensitivity): 3 ng/L (ref <18)

## 2022-11-09 MED ORDER — ATORVASTATIN CALCIUM 10 MG PO TABS: 10.0000 mg | ORAL_TABLET | Freq: Every day | ORAL | 0 refills | Status: DC

## 2022-11-09 MED ORDER — NITROGLYCERIN 0.4 MG SL SUBL
0.4000 mg | SUBLINGUAL_TABLET | SUBLINGUAL | Status: DC | PRN
  Administered 2022-11-09 (×2): 0.4 mg via SUBLINGUAL
  Filled 2022-11-09: qty 1

## 2022-11-09 MED ORDER — KETOROLAC TROMETHAMINE 30 MG/ML IJ SOLN
30.0000 mg | Freq: Once | INTRAMUSCULAR | Status: AC
  Administered 2022-11-09: 30 mg via INTRAVENOUS
  Filled 2022-11-09: qty 1

## 2022-11-09 MED ORDER — ASPIRIN 325 MG PO TABS
325.0000 mg | ORAL_TABLET | Freq: Every day | ORAL | Status: DC
  Administered 2022-11-09: 325 mg via ORAL
  Filled 2022-11-09: qty 1

## 2022-11-09 MED ORDER — ALUM & MAG HYDROXIDE-SIMETH 200-200-20 MG/5ML PO SUSP
30.0000 mL | Freq: Once | ORAL | Status: AC
  Administered 2022-11-09: 30 mL via ORAL
  Filled 2022-11-09: qty 30

## 2022-11-09 MED ORDER — IOHEXOL 350 MG/ML SOLN
75.0000 mL | Freq: Once | INTRAVENOUS | Status: AC | PRN
  Administered 2022-11-09: 75 mL via INTRAVENOUS

## 2022-11-09 NOTE — Discharge Instructions (Addendum)
You need to establish care with a primary care doctor.  I am going to start your Lipitor today.  Your blood pressure is doing well, so you do not need a medication for blood pressure right now.

## 2022-11-09 NOTE — ED Provider Notes (Signed)
[place] EMERGENCY DEPARTMENT AT [facility] Provider Note   CSN: [id number] Arrival date & time: 11/09/22  1229     History  Chief Complaint  Patient presents with   Chest Pain    Donald Lang is a 52 y.o. male.  Pt is a 52 yo male with pmhx significant for htn and hld.  Pt has not been to his doctor since he retired from [employer] in 2020 and is not on any meds for htn or hld.  Pt woke up this am with cp.  He feels like someone is pushing on his chest.  Nothing seems to make it better.  He felt worse when he bent over to tie his shoe.  He's never had anything like this in the past.       Home Medications Prior to Admission medications   Medication Sig Start Date End Date Taking? Authorizing Provider  atorvastatin (LIPITOR) 10 MG tablet Take 1 tablet (10 mg total) by mouth daily. 11/09/22  Yes Isla Pence, MD  acetaminophen (TYLENOL) 500 MG tablet Take 1,000 mg by mouth every 6 (six) hours as needed.    [provider]  ibuprofen (ADVIL) 200 MG tablet Take 200 mg by mouth every 6 (six) hours as needed. Takes 800 mg    [provider]  ondansetron (ZOFRAN) 4 MG tablet Take 4 mg by mouth every 8 (eight) hours as needed for nausea or vomiting.    [provider]  phenazopyridine (PYRIDIUM) 200 MG tablet Take 1 tablet (200 mg total) by mouth 3 (three) times daily as needed for pain. 04/11/20   Ardis Hughs, MD  rizatriptan (MAXALT-MLT) 10 MG disintegrating tablet Take 10 mg by mouth daily as needed for migraine. May repeat in 2 hours if needed    [provider]  traMADol (ULTRAM) 50 MG tablet Take 50 mg by mouth every 6 (six) hours as needed. 1-2 tabs    [provider]      Allergies    Patient has no known allergies.    Review of Systems   Review of Systems  Cardiovascular:  Positive for chest pain.  All other systems reviewed and are negative.   Physical Exam Updated Vital Signs BP 107/80    Pulse 86   Temp 97.9 F (36.6 C) (Oral)   Resp 16   Wt 93 kg   SpO2 95%   BMI 31.17 kg/m  Physical Exam Vitals and nursing note reviewed.  Constitutional:      Appearance: He is well-developed.  HENT:     Head: Normocephalic and atraumatic.  Eyes:     Extraocular Movements: Extraocular movements intact.     Pupils: Pupils are equal, round, and reactive to light.  Cardiovascular:     Rate and Rhythm: Normal rate and regular rhythm.     Heart sounds: Normal heart sounds.  Pulmonary:     Effort: Pulmonary effort is normal.     Breath sounds: Normal breath sounds.  Abdominal:     General: Bowel sounds are normal.     Palpations: Abdomen is soft.  Musculoskeletal:        General: Normal range of motion.     Cervical back: Normal range of motion and neck supple.  Skin:    General: Skin is warm and dry.     Capillary Refill: Capillary refill takes less than 2 seconds.  Neurological:     General: No focal deficit present.  Mental Status: He is alert and oriented to person, place, and time.  Psychiatric:        Mood and Affect: Mood normal.        Behavior: Behavior normal.     ED Results / Procedures / Treatments   Labs (all labs ordered are listed, but only abnormal results are displayed) Labs Reviewed  BASIC METABOLIC PANEL - Abnormal; Notable for the following components:      Result Value   BUN 21 (*)    All other components within normal limits  CBC - Abnormal; Notable for the following components:   WBC 11.3 (*)    All other components within normal limits  LIPID PANEL - Abnormal; Notable for the following components:   Cholesterol 275 (*)    HDL 39 (*)    LDL Cholesterol 210 (*)    All other components within normal limits  TROPONIN I (HIGH SENSITIVITY)  TROPONIN I (HIGH SENSITIVITY)    EKG EKG Interpretation  Date/Time:  Sunday November 09 2022 12:35:13 EST Ventricular Rate:  102 PR Interval:  198 QRS Duration: 94 QT Interval:  350 QTC  Calculation: 456 R Axis:   -26 Text Interpretation: Sinus tachycardia Borderline prolonged PR interval Borderline left axis deviation Since last tracing rate faster Confirmed by Isla Pence [phone]) on 11/09/2022 1:00:30 PM  Radiology CT Angio Chest Pulmonary Embolism (PE) W or WO Contrast  Result Date: 11/09/2022 CLINICAL DATA:  Pulmonary embolus suspected with high probability. Chest pain. EXAM: CT ANGIOGRAPHY CHEST WITH CONTRAST TECHNIQUE: Multidetector CT imaging of the chest was performed using the standard protocol during bolus administration of intravenous contrast. Multiplanar CT image reconstructions and MIPs were obtained to evaluate the vascular anatomy. RADIATION DOSE REDUCTION: This exam was performed according to the departmental dose-optimization program which includes automated exposure control, adjustment of the mA and/or kV according to patient size and/or use of iterative reconstruction technique. CONTRAST:  64m OMNIPAQUE IOHEXOL 350 MG/ML SOLN COMPARISON:  Chest radiograph 11/09/2022 FINDINGS: Cardiovascular: There is good opacification of the central and segmental pulmonary arteries with mild motion artifact. No focal filling defects are identified. No evidence of significant pulmonary embolus. Normal heart size. No pericardial effusions. Normal caliber thoracic aorta. No evidence of aortic dissection. Mediastinum/Nodes: Esophagus is decompressed. Thyroid gland is unremarkable. No significant lymphadenopathy. Multiple calcified lymph nodes are demonstrated in the mediastinum and hila, suggesting postinflammatory granulomatous change. Lungs/Pleura: Motion artifact limits examination. Scattered calcified granulomas. Atelectasis in the lung bases. No pleural effusions. No pneumothorax. Upper Abdomen: Diffuse fatty infiltration of the liver. No acute abnormalities. Musculoskeletal: Degenerative changes in the spine. Postoperative change in the cervical spine. Old appearing midthoracic  vertebral compression deformities. Review of the MIP images confirms the above findings. IMPRESSION: 1. No evidence of significant pulmonary embolus. 2. No evidence of active pulmonary disease. 3. Fatty infiltration of the liver. Electronically Signed   By: WLucienne CapersM.D.   On: 11/09/2022 17:20   DG Chest Port 1 View  Result Date: 11/09/2022 CLINICAL DATA:  Chest pain EXAM: PORTABLE CHEST 1 VIEW COMPARISON:  None Available. FINDINGS: The lungs are clear without focal pneumonia, edema, pneumothorax or pleural effusion. Opacity at the left costophrenic angle compatible with juxta cardiac fat pad as seen on CT scan 03/16/2020. The cardiopericardial silhouette is within normal limits for size. The visualized bony structures of the thorax are unremarkable. Telemetry leads overlie the chest. IMPRESSION: No active disease. Electronically Signed   By: EMisty StanleyM.D.   On: 11/09/2022 13:43  Procedures Procedures    Medications Ordered in ED Medications  aspirin tablet 325 mg (325 mg Oral Given 11/09/22 1330)  nitroGLYCERIN (NITROSTAT) SL tablet 0.4 mg (0.4 mg Sublingual Given 11/09/22 1500)  alum & mag hydroxide-simeth (MAALOX/MYLANTA) 200-200-20 MG/5ML suspension 30 mL (30 mLs Oral Given 11/09/22 1505)  ketorolac (TORADOL) 30 MG/ML injection 30 mg (30 mg Intravenous Given 11/09/22 1603)  iohexol (OMNIPAQUE) 350 MG/ML injection 75 mL (75 mLs Intravenous Contrast Given 11/09/22 1647)    ED Course/ Medical Decision Making/ A&P                             Medical Decision Making Amount and/or Complexity of Data Reviewed Labs: ordered. Radiology: ordered.  Risk OTC drugs. Prescription drug management.   This patient presents to the ED for concern of cp, this involves an extensive number of treatment options, and is a complaint that carries with it a high risk of complications and morbidity.  The differential diagnosis includes cardiac, pulm, gi, msk   Co morbidities that complicate the  patient evaluation  Htn and hld   Additional history obtained:  Additional history obtained from epic chart review External records from outside source obtained and reviewed including wife   Lab Tests:  I Ordered, and personally interpreted labs.  The pertinent results include:  cbc nl, bmp nl, trop nl times 2, lipid panel with total cholesterol at 275 and LDL elevated at 210   Imaging Studies ordered:  I ordered imaging studies including cxr and ct chest I independently visualized and interpreted imaging which showed  CXR:  No active disease.  CT chest: IMPRESSION:  1. No evidence of significant pulmonary embolus.  2. No evidence of active pulmonary disease.  3. Fatty infiltration of the liver.   I agree with the radiologist interpretation   Cardiac Monitoring:  The patient was maintained on a cardiac monitor.  I personally viewed and interpreted the cardiac monitored which showed an underlying rhythm of: nsr   Medicines ordered and prescription drug management:  I ordered medication including nitro  for cp; gi cocktail Reevaluation of the patient after these medicines showed that the patient stayed the same I have reviewed the patients home medicines and have made adjustments as needed   Test Considered:  ct  Problem List / ED Course:  High cholesterol:  pt has been on lipitor in the past and has tolerated that well.  I will restart at 10 mg. Chest pain:  troponin neg; ct chest neg.  Pt does have risk factors and is referred to cards.  He needs to establish care with pcp.   Reevaluation:  After the interventions noted above, I reevaluated the patient and found that they have :improved   Social Determinants of Health:  Lives at home   Dispostion:  After consideration of the diagnostic results and the patients response to treatment, I feel that the patent would benefit from discharge with outpatient f/u.          Final Clinical Impression(s) /  ED Diagnoses Final diagnoses:  High cholesterol  Nonspecific chest pain    Rx / DC Orders ED Discharge Orders          Ordered    Ambulatory referral to Cardiology        11/09/22 1731    atorvastatin (LIPITOR) 10 MG tablet  Daily        11/09/22 1731  Isla Pence, MD 11/09/22 [phone]

## 2022-11-09 NOTE — ED Triage Notes (Signed)
Pt reports centralized chest discomfort when laying on left side that radiated into left arm.  Pt reports when bending over feels the discomfort move to his throat  Accompanied with sob.

## 2022-11-09 NOTE — ED Notes (Signed)
Pt states no discernable change after second nitro admin

## 2022-11-09 NOTE — ED Notes (Signed)
Pt stated no discernable change after nitro admin.

## 2022-11-28 ENCOUNTER — Encounter: Payer: Self-pay | Admitting: Family Medicine

## 2022-11-28 DIAGNOSIS — F909 Attention-deficit hyperactivity disorder, unspecified type: Secondary | ICD-10-CM | POA: Insufficient documentation

## 2022-11-28 DIAGNOSIS — E291 Testicular hypofunction: Secondary | ICD-10-CM | POA: Insufficient documentation

## 2022-11-28 DIAGNOSIS — E785 Hyperlipidemia, unspecified: Secondary | ICD-10-CM | POA: Insufficient documentation

## 2022-12-01 ENCOUNTER — Ambulatory Visit: Admitting: Family Medicine

## 2022-12-01 ENCOUNTER — Encounter: Payer: Self-pay | Admitting: Family Medicine

## 2022-12-01 VITALS — BP 122/70 | HR 83 | Temp 97.6°F | Ht 67.0 in | Wt 218.2 lb

## 2022-12-01 DIAGNOSIS — D3130 Benign neoplasm of unspecified choroid: Secondary | ICD-10-CM | POA: Insufficient documentation

## 2022-12-01 DIAGNOSIS — M8580 Other specified disorders of bone density and structure, unspecified site: Secondary | ICD-10-CM

## 2022-12-01 DIAGNOSIS — J8410 Pulmonary fibrosis, unspecified: Secondary | ICD-10-CM | POA: Insufficient documentation

## 2022-12-01 DIAGNOSIS — R000 Tachycardia, unspecified: Secondary | ICD-10-CM | POA: Insufficient documentation

## 2022-12-01 DIAGNOSIS — Z1211 Encounter for screening for malignant neoplasm of colon: Secondary | ICD-10-CM

## 2022-12-01 DIAGNOSIS — Z860100 Personal history of colon polyps, unspecified: Secondary | ICD-10-CM | POA: Insufficient documentation

## 2022-12-01 DIAGNOSIS — K760 Fatty (change of) liver, not elsewhere classified: Secondary | ICD-10-CM | POA: Insufficient documentation

## 2022-12-01 DIAGNOSIS — I10 Essential (primary) hypertension: Secondary | ICD-10-CM

## 2022-12-01 DIAGNOSIS — Z23 Encounter for immunization: Secondary | ICD-10-CM

## 2022-12-01 DIAGNOSIS — E782 Mixed hyperlipidemia: Secondary | ICD-10-CM | POA: Diagnosis not present

## 2022-12-01 DIAGNOSIS — D169 Benign neoplasm of bone and articular cartilage, unspecified: Secondary | ICD-10-CM | POA: Insufficient documentation

## 2022-12-01 MED ORDER — ATORVASTATIN CALCIUM 10 MG PO TABS: 40.0000 mg | ORAL_TABLET | Freq: Every day | ORAL | 3 refills | Status: DC

## 2022-12-01 NOTE — Progress Notes (Signed)
[facility] [address] [place] [address] Dept: [phone] Dept Fax: [phone]  New Patient Office Visit  Subjective:    Patient ID: Donald Lang, male    DOB: 1971/02/22, 52 y.o..   MRN: [id number]  Chief Complaint  Patient presents with   Establish Care    NP- establish care.   No concerns.     History of Present Illness:  Patient is in today to establish care. Donald Lang was born in [place], [place]. He attended a Therapist, occupational, studying business, but found it didn't hold his interest.. He then transferred to [employer], [place]. He ende dup dropping out of college and joined the [employer]. 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 [place] In [place], he separated from the [employer] and joined the [employer], where he worked with [employer] in [place]. In 2002, his wife had relocated to [place]. In Jan 2020, he retired from [employer]. Currently, he is not employed. Donald Lang and his wife have been married for 29 years. They have two children and two grandchildren. He denies tobacco or alcohol use. He drinks 1-2 drinks a week.  Donald Lang has a history of hypertension, but is not on medication for this. he thinks the stress he was under previously contributed to this.  Donald Lang has a history of hyperlipidemia. He is currently managed on atorvastatin 10 mg daily. He was seen at [facility] on 11/09/2022 with chest pain. His evaluation did not find an acute cardiac event.  Donald Lang notes it has been about 10 years since his last colonoscopy. He notes he has a history of colon polyps.   Donald Lang notes he had a bone density scan done in the past. It showed values in an osteoporosis range, but he was told based on his age this would only be considered osteopenia.   Other past medical history was reviewed and Problem  list updated.  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   Personal history of traumatic brain injury 05/11/2018   History of urinary stone 03/06/2017   Wedge compression fracture of T7-t8 vertebra, sequela 03/06/2017   Tension-type headache, unspecified, not intractable 03/05/2017   Palpitations 02/04/2017   Sleep apnea 12/17/2016   Hyperlipidemia, unspecified 12/17/2016   Essential (primary) hypertension 12/17/2016   Allergic rhinitis, unspecified 12/17/2016   Neck pain 10/15/2016   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 10/15/2016   Past Surgical History:  Procedure Laterality Date   c 5 to C 6 disc repacement  2018   CYSTOSCOPY/URETEROSCOPY/HOLMIUM LASER/STENT PLACEMENT Left 04/11/2020   Procedure: LEFT URETEROSCOPY/HOLMIUM LASER STONE EXTRACTION STENT PLACEMENT;  Surgeon: Ardis Hughs, MD;  Location: [facility];  Service: Urology;  Laterality: Left;   EXTRACORPOREAL SHOCK WAVE LITHOTRIPSY  2005   x2 1990   Family History  Problem Relation Age of Onset   Cancer Mother        breast   Hypertension Father    Hyperlipidemia Father    Diabetes Father    Dementia  Father    Heart disease Maternal Uncle    Heart disease Maternal Uncle    Alzheimer's disease Maternal Grandmother    Cancer Maternal Grandmother        breast   Alzheimer's disease Maternal Grandfather    Cancer Paternal Grandmother        ovarian   Alzheimer's disease Paternal Grandfather    Outpatient Medications Prior to Visit  Medication Sig Dispense Refill    acetaminophen (TYLENOL) 500 MG tablet Take 1,000 mg by mouth every 6 (six) hours as needed.     ibuprofen (ADVIL) 200 MG tablet Take 200 mg by mouth every 6 (six) hours as needed. Takes 800 mg     atorvastatin (LIPITOR) 10 MG tablet Take 1 tablet (10 mg total) by mouth daily. 30 tablet 0   ondansetron (ZOFRAN) 4 MG tablet Take 4 mg by mouth every 8 (eight) hours as needed for nausea or vomiting.     phenazopyridine (PYRIDIUM) 200 MG tablet Take 1 tablet (200 mg total) by mouth 3 (three) times daily as needed for pain. 10 tablet 0   rizatriptan (MAXALT-MLT) 10 MG disintegrating tablet Take 10 mg by mouth daily as needed for migraine. May repeat in 2 hours if needed     traMADol (ULTRAM) 50 MG tablet Take 50 mg by mouth every 6 (six) hours as needed. 1-2 tabs     No facility-administered medications prior to visit.   No Known Allergies Objective:   Today's Vitals   12/01/22 1315  BP: 122/70  Pulse: 83  Temp: 97.6 F (36.4 C)  TempSrc: Temporal  SpO2: 96%  Weight: 218 lb 3.2 oz (99 kg)  Height: '5\' 7"'$  (1.702 m)   Body mass index is 34.17 kg/m.   General: Well developed, well nourished. No acute distress. Psych: Alert and oriented. Normal mood and affect.  Health Maintenance Due  Topic Date Due   HIV Screening  Never done   Hepatitis C Screening  Never done   COLONOSCOPY (Pts 45-60yr Insurance coverage will need to be confirmed)  Never done   Zoster Vaccines- Shingrix (1 of 2) Never done   DTaP/Tdap/Td (5 - Td or Tdap) 01/20/2022   INFLUENZA VACCINE  05/06/2022   Lab Results    Latest Ref Rng & Units 11/09/2022    1:29 PM 04/11/2020    7:39 AM 03/16/2020   12:48 PM  CBC  WBC 4.0 - 10.5 K/uL 11.3   11.3   Hemoglobin 13.0 - 17.0 g/dL 14.9  13.9  15.6   Hematocrit 39.0 - 52.0 % 44.0  41.0  47.6   Platelets 150 - 400 K/uL 205   242       Latest Ref Rng & Units 11/09/2022    1:29 PM 04/11/2020    7:39 AM 03/16/2020   12:48 PM  CMP  Glucose 70 - 99 mg/dL 93  100  148   BUN 6 -  20 mg/dL '21  20  20   '$ Creatinine 0.61 - 1.24 mg/dL 0.98  1.00  1.18   Sodium 135 - 145 mmol/L 138  142  139   Potassium 3.5 - 5.1 mmol/L 3.7  4.2  4.1   Chloride 98 - 111 mmol/L 104  104  104   CO2 22 - 32 mmol/L 23   19   Calcium 8.9 - 10.3 mg/dL 9.1   9.4    Last lipids Lab Results  Component Value Date   CHOL 275 (H) 11/09/2022   HDL  39 (L) 11/09/2022   LDLCALC 210 (H) 11/09/2022   TRIG 131 11/09/2022   CHOLHDL 7.1 11/09/2022   Imaging: CT Angio of Chest (11/09/2022) IMPRESSION: 1. No evidence of significant pulmonary embolus. 2. No evidence of active pulmonary disease. 3. Fatty infiltration of the liver.    Assessment & Plan:   Problem List Items Addressed This Visit       Cardiovascular and Mediastinum   Essential (primary) hypertension - Primary    No evidence of hypertension on labs today. I will monitor this. If it remains normal, we will make this issue inactive       Relevant Medications   atorvastatin (LIPITOR) 10 MG tablet     Musculoskeletal and Integument   Osteopenia    I asked Mr. Heino to obtain the bone density scan for me and we can review this together.        Other   Hyperlipidemia, unspecified    Recent lipids show an LDL that is significantly above goal. I recommend we increase his atorvastatin to a moderate dosage level and repeat the lipids in 3 months.      Relevant Medications   atorvastatin (LIPITOR) 10 MG tablet   Other Visit Diagnoses     Screening for colon cancer       Relevant Orders   Ambulatory referral to Gastroenterology   Need for immunization against influenza       Relevant Orders   Flu Vaccine QUAD 6+ mos PF IM (Fluarix Quad PF) (Completed)   Need for shingles vaccine       Relevant Orders   Varicella-zoster vaccine IM (Completed)   Need for Td vaccine       Relevant Orders   Td : Tetanus/diphtheria >7yo Preservative  free (Completed)       Return in about 3 months (around 03/01/2023) for Reassessment.    Haydee Salter, MD

## 2022-12-01 NOTE — Assessment & Plan Note (Signed)
Recent lipids show an LDL that is significantly above goal. I recommend we increase his atorvastatin to a moderate dosage level and repeat the lipids in 3 months.

## 2022-12-01 NOTE — Assessment & Plan Note (Signed)
I asked Donald Lang to obtain the bone density scan for me and we can review this together.

## 2022-12-01 NOTE — Assessment & Plan Note (Signed)
No evidence of hypertension on labs today. I will monitor this. If it remains normal, we will make this issue inactive

## 2022-12-09 ENCOUNTER — Ambulatory Visit (INDEPENDENT_AMBULATORY_CARE_PROVIDER_SITE_OTHER)

## 2022-12-09 ENCOUNTER — Ambulatory Visit
Admission: RE | Admit: 2022-12-09 | Discharge: 2022-12-09 | Disposition: A | Discharge status: Home / Self Care | Source: Ambulatory Visit | Attending: Internal Medicine | Admitting: Internal Medicine

## 2022-12-09 VITALS — BP 137/83 | HR 80 | Temp 97.8°F | Resp 18

## 2022-12-09 DIAGNOSIS — M25552 Pain in left hip: Secondary | ICD-10-CM

## 2022-12-09 MED ORDER — TRIAMCINOLONE ACETONIDE 40 MG/ML IJ SUSP
40.0000 mg | Freq: Once | INTRAMUSCULAR | Status: AC
  Administered 2022-12-09: 40 mg via INTRAMUSCULAR

## 2022-12-09 MED ORDER — CYCLOBENZAPRINE HCL 5 MG PO TABS: 5.0000 mg | ORAL_TABLET | Freq: Three times a day (TID) | ORAL | 0 refills | Status: DC | PRN

## 2022-12-09 MED ORDER — PREDNISONE 20 MG PO TABS: 20.0000 mg | ORAL_TABLET | Freq: Every day | ORAL | 0 refills | Status: AC

## 2022-12-09 NOTE — ED Triage Notes (Signed)
Pt presents with c/o left hip pain X 2-3 days.

## 2022-12-09 NOTE — ED Provider Notes (Signed)
[facility]    CSN: [id number] Arrival date & time: 12/09/22  [id number]  History   Chief Complaint Chief Complaint  Patient presents with   Hip Pain   HPI Donald Lang is a 52 y.o. male presenting for left hip pain for the past 2 to 3 days.  No known injury.  He states that on Friday he was helping pick up sticks and branches in his yard with tree cutters.  Pain developed over the weekend.  Denies fever, urinary/bowel incontinence, radiation, numbness/tingling.  Pain worse when going from sitting to standing and walking/weightbearing.  Patient has not tried anything for the pain including OTC analgesics. Presents NAD.  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   Attention-deficit hyperactivity disorder, unspecified type 11/28/2022   Male erectile disorder 05/11/2018   Major depressive disorder, single episode, moderate ([place]) 05/11/2018   Personal history of traumatic brain injury 05/11/2018   History of urinary stone 03/06/2017   Wedge compression fracture of T7-t8 vertebra, sequela 03/06/2017   Tension-type headache, unspecified, not intractable 03/05/2017   Palpitations 02/04/2017   Sleep apnea 12/17/2016   Hyperlipidemia, unspecified 12/17/2016   Essential (primary) hypertension 12/17/2016   Allergic rhinitis, unspecified 12/17/2016   Neck pain 10/15/2016   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;  Service: Urology;  Laterality: Left;   EXTRACORPOREAL SHOCK WAVE LITHOTRIPSY  2005   x2 1990     Home Medications    Prior to Admission medications   Medication Sig Start Date End Date Taking? Authorizing Provider  acetaminophen (TYLENOL) 500 MG tablet Take 1,000 mg by mouth every 6 (six) hours as needed.    [provider]  atorvastatin (LIPITOR) 10 MG tablet Take 4 tablets (40 mg total) by mouth daily. 12/01/22   Haydee Salter, MD  ibuprofen (ADVIL) 200 MG tablet Take 200 mg by mouth every 6 (six) hours as needed. Takes 800 mg    [provider]    Family History Family History  Problem Relation Age of Onset   Cancer Mother        breast   Hypertension Father    Hyperlipidemia Father    Diabetes Father    Dementia Father    Heart disease Maternal Uncle    Heart disease Maternal Uncle    Alzheimer's disease Maternal Grandmother    Cancer Maternal Grandmother        breast   Alzheimer's disease Maternal Grandfather    Cancer Paternal Grandmother        ovarian   Alzheimer's disease Paternal Grandfather     Social History Social History   Tobacco Use   Smoking status: Never  Smokeless tobacco: Never  Vaping Use   Vaping Use: Never used  Substance Use Topics   Alcohol use: Yes    Comment: occ   Drug use: Not Currently     Allergies   Patient has no known allergies.   Review of Systems Review of Systems  Constitutional:  Positive for activity change. Negative for chills and fever.  Cardiovascular:  Negative for leg swelling.  Musculoskeletal:  Positive for arthralgias. Negative for back pain.  Neurological:  Negative for numbness.     Physical Exam Triage Vital Signs ED Triage Vitals  Enc Vitals Group     BP  12/09/22 0858 137/83     Pulse Rate 12/09/22 0858 80     Resp 12/09/22 0858 18     Temp 12/09/22 0858 97.8 F (36.6 C)     Temp Source 12/09/22 0858 Oral     SpO2 12/09/22 0858 96 %     Weight --      Height --      Head Circumference --      Peak Flow --      Pain Score 12/09/22 0857 10     Pain Loc --      Pain Edu? --      Excl. in [place]? --    Physical Exam Constitutional:      General: He is not in acute distress.    Appearance: Normal appearance. He is well-developed and overweight. He is not ill-appearing or toxic-appearing.  Cardiovascular:     Rate and Rhythm: Normal rate and regular rhythm.     Heart sounds: Normal heart sounds.  Pulmonary:     Effort: Pulmonary effort is normal.     Breath sounds: Normal breath sounds.     Comments: Clear to auscultation bilaterally Abdominal:     General: Bowel sounds are normal.     Palpations: Abdomen is soft.     Tenderness: There is no abdominal tenderness. There is no right CVA tenderness or left CVA tenderness.  Musculoskeletal:     Lumbar back: Normal. No tenderness.     Right hip: Normal.     Left hip: Tenderness present. Decreased range of motion.     Comments: Decreased range of motion in left hip due to pain with walking/weightbearing and extension.  TTP along left lateral hip.  Skin:    General: Skin is warm and dry.  Neurological:     General: No focal deficit present.     Mental Status: He is alert.  Psychiatric:        Mood and Affect: Mood and affect normal.      UC Treatments / Results  Labs (all labs ordered are listed, but only abnormal results are displayed) Labs Reviewed - No data to display  Radiology DG Hip Unilat With Pelvis 2-3 Views Left  Result Date: 12/09/2022 CLINICAL DATA:  53 year old male with 3 days of left hip pain. No known injury. EXAM: DG HIP (WITH OR WITHOUT PELVIS) 2-3V LEFT COMPARISON:  CT Abdomen and Pelvis 03/16/2020. FINDINGS: Bone mineralization is within normal limits. Pelvis  appears intact. Femoral heads are normally located. Symmetric SI joints. Incidental chronic pelvic phleboliths. Negative visible bowel gas. Grossly intact proximal right femur. Proximal left femur appears intact. Relatively stable and symmetric hip joint spaces. No acute osseous abnormality identified. IMPRESSION: No acute osseous abnormality identified about the left hip or pelvis. Electronically Signed   By: Genevie Ann M.D.   On: 12/09/2022 09:25  Medications Ordered in UC Medications  triamcinolone acetonide (KENALOG-40) injection 40 mg (has no administration in time range)    Initial Impression / Assessment and Plan / UC Course  I have reviewed the triage vital signs and the nursing notes.  Pertinent labs & imaging results that were available during my care of the patient were reviewed by me and considered in my medical decision making (see chart for details).  Left Hip Pain: Afebrile, nontoxic-appearing, NAD. VSS.  DDX includes but not limited to: osteoarthritis, trochanteric bursitis, referred low back pain. Imaging unremarkable except for minor arthritic changes.Kenalog and short course of steroids provided for possible bursitis and inflammation. Tylenol PRN for pain. Muscle relaxer instructed to take only at night for spasms. Strict ED precautions were given and patient verbalized understanding.  Final Clinical Impressions(s) / UC Diagnoses      Discharge Instructions      Your x-rays showed no broken bones. Your were given a steroid injection today in office (Kenalog) for inflammation and pain. You were also given a short dose of steroids to take at home. Start them tomorrow given your injection today. NSAIDs (Ibuprofen, Aleve) may upset your stomach when taken with the steroids. Recommend Tylenol as needed for pain. You were also given a prescription for muscle relaxer (Flexeril) take at night when you are not driving or working because it may cause dizziness.  Return in 2 to 3  days if no improvement. If new or worsening symptoms develop such as bowel/urinary incontinence, numbness/tingling, fevers, or radiation, it is recommended you go directly to the ER.      ED Prescriptions     Medication Sig Dispense Auth. Provider   predniSONE (DELTASONE) 20 MG tablet Take 1 tablet (20 mg total) by mouth daily for 4 days. 4 tablet Miakoda Mcmillion P, PA-C   cyclobenzaprine (FLEXERIL) 5 MG tablet Take 1 tablet (5 mg total) by mouth 3 (three) times daily as needed for muscle spasms. 10 tablet Latif Nazareno P, PA-C      I have reviewed the PDMP during this encounter.   Carmie End, PA-C 12/09/22 [id number]

## 2022-12-09 NOTE — Discharge Instructions (Addendum)
Your x-rays showed no broken bones. Your were given a steroid injection today in office (Kenalog) for inflammation and pain. You were also given a short dose of steroids to take at home. Start them tomorrow given your injection today. NSAIDs (Ibuprofen, Aleve) may upset your stomach when taken with the steroids. Recommend Tylenol as needed for pain. You were also given a prescription for muscle relaxer (Flexeril) take at night when you are not driving or working because it may cause dizziness.  Return in 2 to 3 days if no improvement. If new or worsening symptoms develop such as bowel/urinary incontinence, numbness/tingling, fevers, or radiation, it is recommended you go directly to the ER.

## 2023-03-03 ENCOUNTER — Ambulatory Visit: Admitting: Family Medicine

## 2023-03-16 ENCOUNTER — Ambulatory Visit: Admitting: Family Medicine

## 2023-03-16 ENCOUNTER — Encounter: Payer: Self-pay | Admitting: Family Medicine

## 2023-03-16 VITALS — BP 134/82 | HR 76 | Temp 98.3°F | Ht 67.0 in | Wt 214.2 lb

## 2023-03-16 DIAGNOSIS — Z23 Encounter for immunization: Secondary | ICD-10-CM

## 2023-03-16 DIAGNOSIS — R079 Chest pain, unspecified: Secondary | ICD-10-CM

## 2023-03-16 DIAGNOSIS — R002 Palpitations: Secondary | ICD-10-CM

## 2023-03-16 DIAGNOSIS — I10 Essential (primary) hypertension: Secondary | ICD-10-CM

## 2023-03-16 DIAGNOSIS — E782 Mixed hyperlipidemia: Secondary | ICD-10-CM

## 2023-03-16 DIAGNOSIS — Z1159 Encounter for screening for other viral diseases: Secondary | ICD-10-CM

## 2023-03-16 LAB — LIPID PANEL
Cholesterol: 221 mg/dL — ABNORMAL HIGH (ref 0–200)
HDL: 52.7 mg/dL (ref >39.00)
LDL Cholesterol: 149 mg/dL — ABNORMAL HIGH (ref 0–99)
NonHDL: 168.01
Total CHOL/HDL Ratio: 4
Triglycerides: 96 mg/dL (ref 0.0–149.0)
VLDL: 19.2 mg/dL (ref 0.0–40.0)

## 2023-03-16 NOTE — Progress Notes (Signed)
[facility] [address] [place] [address] Dept: [phone] Dept Fax: [phone]  Chronic Care Office Visit  Subjective:    Patient ID: Donald Lang, male    DOB: [birth year], 52 y.o..   MRN: [id number]  Chief Complaint  Patient presents with   Medical Management of Chronic Issues    3 month f/u. Fasting today.     History of Present Illness:  Patient is in today for reassessment of chronic medical issues.  Mr. Demeo has a history of hypertension. At his initial office visit, his BP was normal. We are monitoring this for now.   Mr. Dyar has a history of hyperlipidemia. He is currently managed on atorvastatin 10 mg daily. He was seen at [facility] on 11/09/2022 with chest pain. His evaluation did not find an acute cardiac event. He has not had further cardiac issues. He was advised to see a cardiologist, but has never gone for this.   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   Attention-deficit hyperactivity disorder, unspecified type 11/28/2022   Male erectile disorder 05/11/2018   Major depressive disorder, single episode, moderate ([facility]) 05/11/2018   Personal history of traumatic brain injury 05/11/2018   History of urinary stone 03/06/2017   Wedge compression fracture of T7-t8 vertebra, sequela 03/06/2017   Tension-type headache, unspecified, not intractable 03/05/2017   Palpitations 02/04/2017   Sleep apnea 12/17/2016   Hyperlipidemia, unspecified 12/17/2016   Essential (primary) hypertension 12/17/2016   Allergic rhinitis, unspecified 12/17/2016   Neck pain 10/15/2016   Disorder of intervertebral disc of mid-cervical  region 10/15/2016   Past Surgical History:  Procedure Laterality Date   c 5 to C 6 disc repacement  2018   CYSTOSCOPY/URETEROSCOPY/HOLMIUM LASER/STENT PLACEMENT Left 04/11/2020   Procedure: LEFT URETEROSCOPY/HOLMIUM LASER STONE EXTRACTION STENT PLACEMENT;  Surgeon: Crist Fat, MD;  Location: [facility];  Service: Urology;  Laterality: Left;   EXTRACORPOREAL SHOCK WAVE LITHOTRIPSY  2005   x2 1990   Family History  Problem Relation Age of Onset   Cancer Mother        breast   Hypertension Father    Hyperlipidemia Father    Diabetes Father    Dementia Father    Heart disease Maternal Uncle    Heart disease Maternal Uncle    Alzheimer's disease Maternal Grandmother    Cancer Maternal Grandmother        breast   Alzheimer's disease Maternal Grandfather    Cancer Paternal Grandmother        ovarian   Alzheimer's disease Paternal Grandfather    Outpatient Medications Prior to Visit  Medication Sig Dispense Refill   acetaminophen (TYLENOL) 500 MG tablet Take 1,000 mg by mouth every 6 (six) hours as needed.     atorvastatin (LIPITOR) 10 MG tablet Take 4 tablets (40 mg total) by mouth daily. 90 tablet 3   ibuprofen (ADVIL) 200 MG tablet Take 200 mg by mouth every 6 (six) hours as needed. Takes 800 mg     cyclobenzaprine (FLEXERIL) 5 MG tablet Take 1 tablet (5 mg total) by mouth 3 (three) times daily as needed for muscle spasms. 10 tablet 0   No facility-administered medications prior to  visit.   No Known Allergies Objective:   Today's Vitals   03/16/23 1017  BP: 134/82  Pulse: 76  Temp: 98.3 F (36.8 C)  TempSrc: Temporal  SpO2: 97%  Weight: 214 lb 3.2 oz (97.2 kg)  Height: 5\' 7"  (1.702 m)   Body mass index is 33.55 kg/m.   General: Well developed, well nourished. No acute distress. Psych: Alert and oriented. Normal mood and affect.  Health Maintenance Due  Topic Date Due   HIV Screening  Never done   Hepatitis C Screening  Never done    Zoster Vaccines- Shingrix (2 of 2) 01/26/2023     Assessment & Plan:   Problem List Items Addressed This Visit       Cardiovascular and Mediastinum   Essential (primary) hypertension - Primary    BP is in a Stage 1 hypertension range today. I recommend he obtain a home blood pressure cuff. He should monitor this. We will reassess at his next visit.         Other   Palpitations    Prior palpitations/tachycardia by history. I will have him assessed by cardiology. He may benefit from being on metoprolol to manage this and improve his BP control.      Relevant Orders   Ambulatory referral to Cardiology   Hyperlipidemia, unspecified    Now on atorvastatin  10 mg daily. We will repeat lipids today.      Relevant Orders   Lipid panel   Other Visit Diagnoses     Chest pain, unspecified type       Has a history of hyeprtension and hyperlipidemia. Cardiac eval. in ED was benign. I will refer him to cardiology to compelte evaluation.   Relevant Orders   Ambulatory referral to Cardiology   Need for zoster vaccination       Relevant Orders   Varicella-zoster vaccine IM   Encounter for hepatitis C screening test for low risk patient       Relevant Orders   HCV Ab w Reflex to Quant PCR       Return in about 6 months (around 09/15/2023) for Reassessment.   Loyola Mast, MD

## 2023-03-16 NOTE — Assessment & Plan Note (Signed)
Now on atorvastatin  10 mg daily. We will repeat lipids today.

## 2023-03-16 NOTE — Assessment & Plan Note (Signed)
Prior palpitations/tachycardia by history. I will have him assessed by cardiology. He may benefit from being on metoprolol to manage this and improve his BP control.

## 2023-03-16 NOTE — Assessment & Plan Note (Signed)
BP is in a Stage 1 hypertension range today. I recommend he obtain a home blood pressure cuff. He should monitor this. We will reassess at his next visit.

## 2023-03-17 MED ORDER — ATORVASTATIN CALCIUM 20 MG PO TABS: 40.0000 mg | ORAL_TABLET | Freq: Every day | ORAL | 3 refills | Status: DC

## 2023-03-17 NOTE — Addendum Note (Signed)
Addended by: Loyola Mast on: 03/17/2023 08:14 AM   Modules accepted: Orders

## 2023-05-03 NOTE — Progress Notes (Signed)
Cardiology Office Note:   Date:  05/04/2023  NAME:  Donald Lang    MRN: [id number] DOB:  January 17, 1971   PCP:  Donald Mast, MD  Cardiologist:  None  Electrophysiologist:  None   Referring MD: Donald Mast, MD   Chief Complaint  Patient presents with   Tachycardia    History of Present Illness:   Donald Lang is a 52 y.o. male with a hx of HTN, HLD who is being seen today for the evaluation of chest pain/palpitations at the request of Donald Mast, MD. ER for CP 11/09/2022. CT PE study negative. No coronary calcium noted. You have had an achiness in his chest in February.  Seen in the emergency room.  Workup was negative.  CT PE study negative.  No coronary calcium noted.  No further episodes.  Not triggered by activity.  Occurred at rest.  Not associated with food.  Seems to be a 1 off episode.  Has had no further symptoms.  He does describe some dizziness that he had 1 week ago.  He tells me he was working in the yard.  Mainly picking up sticks.  He tells me he had plenty to eat and drink that day.  He went and sat down for dinner and had dizzy episodes for about 30 seconds.  He describes having 3 episodes and repetition.  He did not pass out.  He just felt lightheaded and had the blinders come on.  Symptoms were short-lived.  No further episodes.  He describes being in the [employer].  He is retired.  He tells me he had high blood pressure when he was in the [place] but has no longer had any high blood pressure.  Labs do showed elevated cholesterol and he is now on atorvastatin.  His EKG shows sinus rhythm with PVCs.  He tells me he does not feel any extra heartbeats.  We discussed this could be a normal finding but would like to check this out given his symptoms of dizziness.  He has no coronary calcium so likely low risk for CAD.  He describes a family history of heart disease in his paternal uncle.  He is married.  Has 2 children.  They are no longer in the house.  No regular exercise  reported.  No further chest pain or dizziness reported.  Problem List HLD -T chol 221, HDL 52, LDL 149, TG 96 -no CAC on CT PE study 11/2022 2. HTN  Past Medical History: Past Medical History:  Diagnosis Date   Headache    tension gheadaches   High cholesterol    History of kidney stones    Hypertension    no current bp meds   Sleep apnea    cpap    Past Surgical History: Past Surgical History:  Procedure Laterality Date   c 5 to C 6 disc repacement  2018   CYSTOSCOPY/URETEROSCOPY/HOLMIUM LASER/STENT PLACEMENT Left 04/11/2020   Procedure: LEFT URETEROSCOPY/HOLMIUM LASER STONE EXTRACTION STENT PLACEMENT;  Surgeon: Crist Fat, MD;  Location: [facility];  Service: Urology;  Laterality: Left;   EXTRACORPOREAL SHOCK WAVE LITHOTRIPSY  2005   x2 1990    Current Medications: Current Meds  Medication Sig   acetaminophen (TYLENOL) 500 MG tablet Take 1,000 mg by mouth every 6 (six) hours as needed.   atorvastatin (LIPITOR) 20 MG tablet Take 2 tablets (40 mg total) by mouth daily.   ibuprofen (ADVIL) 200 MG tablet Take 200 mg by mouth every  6 (six) hours as needed. Takes 800 mg     Allergies:    Patient has no known allergies.   Social History: Social History   Socioeconomic History   Marital status: Married    Spouse name: Not on file   Number of children: 2   Years of education: Not on file   Highest education level: Not on file  Occupational History   Occupation: Retired Cabin crew  Tobacco Use   Smoking status: Never   Smokeless tobacco: Never  Vaping Use   Vaping status: Never Used  Substance and Sexual Activity   Alcohol use: Yes    Comment: occ   Drug use: Not Currently   Sexual activity: Yes  Other Topics Concern   Not on file  Social History Narrative   Not on file   Social Determinants of Health   Financial Resource Strain: Not on file  Food Insecurity: Not on file  Transportation Needs: Not on file  Physical Activity: Not on  file  Stress: Not on file  Social Connections: Not on file     Family History: The patient's family history includes Alzheimer's disease in his maternal grandfather, maternal grandmother, and paternal grandfather; Cancer in his maternal grandmother, mother, and paternal grandmother; Dementia in his father; Diabetes in his father; Heart disease in his maternal uncle, maternal uncle, and paternal uncle; Hyperlipidemia in his father; Hypertension in his father.  ROS:   All other ROS reviewed and negative. Pertinent positives noted in the HPI.     EKGs/Labs/Other Studies Reviewed:   The following studies were personally reviewed by me today:  EKG:  EKG is ordered today.    EKG Interpretation Date/Time:  Monday May 04 2023 08:58:44 EDT Ventricular Rate:  58 PR Interval:  218 QRS Duration:  86 QT Interval:  404 QTC Calculation: 396 R Axis:   -6  Text Interpretation: Sinus bradycardia with 1st degree A-V block with occasional Premature ventricular complexes Cannot rule out Anterior infarct , age undetermined Confirmed by Lennie Odor ([id number]) on 05/04/2023 9:03:26 AM   Recent Labs: 11/09/2022: BUN 21; Creatinine, Ser 0.98; Hemoglobin 14.9; Platelets 205; Potassium 3.7; Sodium 138   Recent Lipid Panel    Component Value Date/Time   CHOL 221 (H) 03/16/2023 1101   TRIG 96.0 03/16/2023 1101   HDL 52.70 03/16/2023 1101   CHOLHDL 4 03/16/2023 1101   VLDL 19.2 03/16/2023 1101   LDLCALC 149 (H) 03/16/2023 1101    Physical Exam:   VS:  BP 110/80   Pulse (!) 58   Ht 5\' 8"  (1.727 m)   Wt 215 lb (97.5 kg)   SpO2 97%   BMI 32.69 kg/m    Wt Readings from Last 3 Encounters:  05/04/23 215 lb (97.5 kg)  03/16/23 214 lb 3.2 oz (97.2 kg)  12/01/22 218 lb 3.2 oz (99 kg)    General: Well nourished, well developed, in no acute distress Head: Atraumatic, normal size  Eyes: PEERLA, EOMI  Neck: Supple, no JVD Endocrine: No thryomegaly Cardiac: Normal S1, S2; RRR; no murmurs, rubs, or  gallops Lungs: Clear to auscultation bilaterally, no wheezing, rhonchi or rales  Abd: Soft, nontender, no hepatomegaly  Ext: No edema, pulses 2+ Musculoskeletal: No deformities, BUE and BLE strength normal and equal Skin: Warm and dry, no rashes   Neuro: Alert and oriented to person, place, time, and situation, CNII-XII grossly intact, no focal deficits  Psych: Normal mood and affect   ASSESSMENT:   Falcon Cucinella is a 52  y.o. male who presents for the following: 1. Precordial pain   2. Palpitations   3. PVC (premature ventricular contraction)   4. Dizziness     PLAN:   1. Precordial pain 2. Palpitations 3. PVC (premature ventricular contraction) 4. Dizziness -Suspect his episode in February was noncardiac chest pain.  Coronary calcium score 0 on my review of his CT PE study.  He does have family history of heart disease but no further chest symptoms.  This does not need further evaluation. -Describes some dizziness after doing yard work.  Suspect this was just positional or vasovagal in nature.  He is EKG does have PVCs and a first-degree AV block.  I would like for him to get an echo as well as proceed with a 1 week ZIO to exclude frequent PVCs as etiology.  He does not need a stress evaluation.  Coronary calcium of 0.   -He may continue his current lipid-lowering agents.  He will see me back as needed based on the results of testing. -check TSH today. .   Disposition: Return if symptoms worsen or fail to improve.  Medication Adjustments/Labs and Tests Ordered: Current medicines are reviewed at length with the patient today.  Concerns regarding medicines are outlined above.  Orders Placed This Encounter  Procedures   TSH   LONG TERM MONITOR (3-14 DAYS)   EKG 12-Lead   ECHOCARDIOGRAM COMPLETE   No orders of the defined types were placed in this encounter.  Patient Instructions     Testing/Procedures:  Your physician has requested that you have an echocardiogram.  Echocardiography is a painless test that uses sound waves to create images of your heart. It provides your doctor with information about the size and shape of your heart and how well your heart's chambers and valves are working. This procedure takes approximately one hour. There are no restrictions for this procedure. Please do NOT wear cologne, perfume, aftershave, or lotions (deodorant is allowed). Please arrive 15 minutes prior to your appointment time. [address]  ZIO AT Long term monitor-Live Telemetry  Your physician has requested you wear a ZIO patch monitor for 7 days.  This is a single patch monitor. [employer] supplies one patch monitor per enrollment. Additional  stickers are not available.  Please do not apply patch if you will be having a Nuclear Stress Test, Echocardiogram, Cardiac CT, MRI,  or Chest Xray during the period you would be wearing the monitor. The patch cannot be worn during  these tests. You cannot remove and re-apply the ZIO AT patch monitor.  Your ZIO patch monitor will be mailed 3 day USPS to your address on file. It may take 3-5 days to  receive your monitor after you have been enrolled.  Once you have received your monitor, please review the enclosed instructions. Your monitor has  already been registered assigning a specific monitor serial # to you.   Billing and Patient Assistance Program information  Meredeth Ide has been supplied with any insurance information on record for billing. [employer] offers a sliding scale Patient Assistance Program for patients without insurance, or whose  insurance does not completely cover the cost of the ZIO patch monitor. You must apply for the  Patient Assistance Program to qualify for the discounted rate. To apply, call [employer] at [phone],  select option 4, select option 2 , ask to apply for the Patient Assistance Program, (you can request an  interpreter if needed). [employer] will ask your household income and how  many people are in your  household. [employer] will quote your out-of-pocket cost based on this information. They will also be able  to set up a 12 month interest free payment plan if needed.  Applying the monitor   Shave hair from upper left chest.  Hold the abrader disc by orange tab. Rub the abrader in 40 strokes over left upper chest as indicated in  your monitor instructions.  Clean area with 4 enclosed alcohol pads. Use all pads to ensure the area is cleaned thoroughly. Let  dry.  Apply patch as indicated in monitor instructions. Patch will be placed under collarbone on left side of  chest with arrow pointing upward.  Rub patch adhesive wings for 2 minutes. Remove the white label marked "1". Remove the white label  marked "2". Rub patch adhesive wings for 2 additional minutes.  While looking in a mirror, press and release button in center of patch. A small green light will flash 3-4  times. This will be your only indicator that the monitor has been turned on.  Do not shower for the first 24 hours. You may shower after the first 24 hours.  Press the button if you feel a symptom. You will hear a small click. Record Date, Time and Symptom in  the Patient Log.   Starting the Gateway  In your kit there is a Audiological scientist box the size of a cellphone. This is Buyer, retail. It transmits all your  recorded data to [facility]. This box must always stay within 10 feet of you. Open the box and push the *  button. There will be a light that blinks orange and then green a few times. When the light stops  blinking, the Gateway is connected to the ZIO patch. Call [employer] at [phone] to confirm your monitor is transmitting.  Returning your monitor  Remove your patch and place it inside the Gateway. In the lower half of the Gateway there is a white  bag with prepaid postage on it. Place Gateway in bag and seal. Mail package back to [place] as soon as  possible. Your physician should have your  final report approximately 7 days after you have mailed back  your monitor. Call [facility] Customer Care at [phone] if you have questions regarding your ZIO AT  patch monitor. Call them immediately if you see an orange light blinking on your monitor.  If your monitor falls off in less than 4 days, contact our Monitor department at [phone]. If your  monitor becomes loose or falls off after 4 days call [employer] at [phone] for suggestions on  securing your monitor    Follow-Up: At [facility], you and your health needs are our priority.  As part of our continuing mission to provide you with exceptional heart care, we have created designated Provider Care Teams.  These Care Teams include your primary Cardiologist (physician) and Advanced Practice Providers (APPs -  Physician Assistants and Nurse Practitioners) who all work together to provide you with the care you need, when you need it.  We recommend signing up for the patient portal called "MyChart".  Sign up information is provided on this After Visit Summary.  MyChart is used to connect with patients for Virtual Visits (Telemedicine).  Patients are able to view lab/test results, encounter notes, upcoming appointments, etc.  Non-urgent messages can be sent to your provider as well.   To learn more about what you can do with MyChart, go to [email or web address].  Your next appointment:    AS NEEDED    Signed, Gerri Spore T. Flora Lipps, MD, [facility]  [facility]  [address], [address] [place], [place] [address] [phone]  05/04/2023 9:40 AM

## 2023-05-04 ENCOUNTER — Encounter: Payer: Self-pay | Admitting: Family Medicine

## 2023-05-04 ENCOUNTER — Ambulatory Visit: Attending: Cardiovascular Disease

## 2023-05-04 ENCOUNTER — Ambulatory Visit: Attending: Cardiovascular Disease | Admitting: Cardiovascular Disease

## 2023-05-04 ENCOUNTER — Encounter: Payer: Self-pay | Admitting: Cardiovascular Disease

## 2023-05-04 VITALS — BP 110/80 | HR 58 | Ht 68.0 in | Wt 215.0 lb

## 2023-05-04 DIAGNOSIS — R072 Precordial pain: Secondary | ICD-10-CM | POA: Diagnosis not present

## 2023-05-04 DIAGNOSIS — I440 Atrioventricular block, first degree: Secondary | ICD-10-CM | POA: Insufficient documentation

## 2023-05-04 DIAGNOSIS — R002 Palpitations: Secondary | ICD-10-CM

## 2023-05-04 DIAGNOSIS — I493 Ventricular premature depolarization: Secondary | ICD-10-CM | POA: Diagnosis not present

## 2023-05-04 DIAGNOSIS — R42 Dizziness and giddiness: Secondary | ICD-10-CM | POA: Diagnosis not present

## 2023-05-04 NOTE — Progress Notes (Unsigned)
Enrolled for [employer] to mail a ZIO XT long term holter monitor to the patients address on file.  

## 2023-05-04 NOTE — Patient Instructions (Signed)
Testing/Procedures:  Your physician has requested that you have an echocardiogram. Echocardiography is a painless test that uses sound waves to create images of your heart. It provides your doctor with information about the size and shape of your heart and how well your heart's chambers and valves are working. This procedure takes approximately one hour. There are no restrictions for this procedure. Please do NOT wear cologne, perfume, aftershave, or lotions (deodorant is allowed). Please arrive 15 minutes prior to your appointment time. [address]  ZIO AT Long term monitor-Live Telemetry  Your physician has requested you wear a ZIO patch monitor for 7 days.  This is a single patch monitor. [employer] supplies one patch monitor per enrollment. Additional  stickers are not available.  Please do not apply patch if you will be having a Nuclear Stress Test, Echocardiogram, Cardiac CT, MRI,  or Chest Xray during the period you would be wearing the monitor. The patch cannot be worn during  these tests. You cannot remove and re-apply the ZIO AT patch monitor.  Your ZIO patch monitor will be mailed 3 day USPS to your address on file. It may take 3-5 days to  receive your monitor after you have been enrolled.  Once you have received your monitor, please review the enclosed instructions. Your monitor has  already been registered assigning a specific monitor serial # to you.   Billing and Patient Assistance Program information  Meredeth Ide has been supplied with any insurance information on record for billing. [employer] offers a sliding scale Patient Assistance Program for patients without insurance, or whose  insurance does not completely cover the cost of the ZIO patch monitor. You must apply for the  Patient Assistance Program to qualify for the discounted rate. To apply, call [employer] at [phone],  select option 4, select option 2 , ask to apply for the Patient Assistance Program,  (you can request an  interpreter if needed). [employer] will ask your household income and how many people are in your  household. [employer] will quote your out-of-pocket cost based on this information. They will also be able  to set up a 12 month interest free payment plan if needed.  Applying the monitor   Shave hair from upper left chest.  Hold the abrader disc by orange tab. Rub the abrader in 40 strokes over left upper chest as indicated in  your monitor instructions.  Clean area with 4 enclosed alcohol pads. Use all pads to ensure the area is cleaned thoroughly. Let  dry.  Apply patch as indicated in monitor instructions. Patch will be placed under collarbone on left side of  chest with arrow pointing upward.  Rub patch adhesive wings for 2 minutes. Remove the white label marked "1". Remove the white label  marked "2". Rub patch adhesive wings for 2 additional minutes.  While looking in a mirror, press and release button in center of patch. A small green light will flash 3-4  times. This will be your only indicator that the monitor has been turned on.  Do not shower for the first 24 hours. You may shower after the first 24 hours.  Press the button if you feel a symptom. You will hear a small click. Record Date, Time and Symptom in  the Patient Log.   Starting the Gateway  In your kit there is a Audiological scientist box the size of a cellphone. This is [employer], retail. It transmits all your  recorded data to [facility]. This  box must always stay within 10 feet of you. Open the box and push the *  button. There will be a light that blinks orange and then green a few times. When the light stops  blinking, the Gateway is connected to the ZIO patch. Call [employer] at [phone] to confirm your monitor is transmitting.  Returning your monitor  Remove your patch and place it inside the Gateway. In the lower half of the Gateway there is a white  bag with prepaid postage on it. Place Gateway in bag and  seal. Mail package back to [place] as soon as  possible. Your physician should have your final report approximately 7 days after you have mailed back  your monitor. Call [facility] Customer Care at [phone] if you have questions regarding your ZIO AT  patch monitor. Call them immediately if you see an orange light blinking on your monitor.  If your monitor falls off in less than 4 days, contact our Monitor department at [phone]. If your  monitor becomes loose or falls off after 4 days call [employer] at [phone] for suggestions on  securing your monitor    Follow-Up: At [facility], you and your health needs are our priority.  As part of our continuing mission to provide you with exceptional heart care, we have created designated Provider Care Teams.  These Care Teams include your primary Cardiologist (physician) and Advanced Practice Providers (APPs -  Physician Assistants and Nurse Practitioners) who all work together to provide you with the care you need, when you need it.  We recommend signing up for the patient portal called "MyChart".  Sign up information is provided on this After Visit Summary.  MyChart is used to connect with patients for Virtual Visits (Telemedicine).  Patients are able to view lab/test results, encounter notes, upcoming appointments, etc.  Non-urgent messages can be sent to your provider as well.   To learn more about what you can do with MyChart, go to [email or web address].    Your next appointment:    AS NEEDED

## 2023-05-06 ENCOUNTER — Encounter: Payer: Self-pay | Admitting: *Deleted

## 2023-05-25 ENCOUNTER — Ambulatory Visit (HOSPITAL_COMMUNITY): Attending: Cardiology

## 2023-05-25 DIAGNOSIS — I493 Ventricular premature depolarization: Secondary | ICD-10-CM

## 2023-05-25 DIAGNOSIS — I10 Essential (primary) hypertension: Secondary | ICD-10-CM | POA: Diagnosis not present

## 2023-05-25 LAB — ECHOCARDIOGRAM COMPLETE
Area-P 1/2: 2.13 cm2
S' Lateral: 3.1 cm

## 2023-09-15 ENCOUNTER — Ambulatory Visit: Admitting: Family Medicine

## 2023-09-22 ENCOUNTER — Other Ambulatory Visit: Payer: Self-pay | Admitting: Family Medicine

## 2023-09-22 DIAGNOSIS — E782 Mixed hyperlipidemia: Secondary | ICD-10-CM

## 2023-10-05 ENCOUNTER — Ambulatory Visit: Admitting: Family Medicine

## 2023-10-05 VITALS — BP 124/76 | HR 65 | Temp 98.0°F | Ht 68.0 in | Wt 213.6 lb

## 2023-10-05 DIAGNOSIS — E782 Mixed hyperlipidemia: Secondary | ICD-10-CM | POA: Diagnosis not present

## 2023-10-05 DIAGNOSIS — M722 Plantar fascial fibromatosis: Secondary | ICD-10-CM | POA: Insufficient documentation

## 2023-10-05 DIAGNOSIS — I10 Essential (primary) hypertension: Secondary | ICD-10-CM

## 2023-10-05 DIAGNOSIS — Z23 Encounter for immunization: Secondary | ICD-10-CM | POA: Diagnosis not present

## 2023-10-05 DIAGNOSIS — Z87442 Personal history of urinary calculi: Secondary | ICD-10-CM

## 2023-10-05 LAB — LIPID PANEL
Cholesterol: 211 mg/dL — ABNORMAL HIGH (ref 0–200)
HDL: 48.4 mg/dL (ref >39.00)
LDL Cholesterol: 129 mg/dL — ABNORMAL HIGH (ref 0–99)
NonHDL: 162.87
Total CHOL/HDL Ratio: 4
Triglycerides: 169 mg/dL — ABNORMAL HIGH (ref 0.0–149.0)
VLDL: 33.8 mg/dL (ref 0.0–40.0)

## 2023-10-05 MED ORDER — ATORVASTATIN CALCIUM 40 MG PO TABS: 40.0000 mg | ORAL_TABLET | Freq: Every day | ORAL | 3 refills | Status: DC

## 2023-10-05 NOTE — Assessment & Plan Note (Signed)
BP is normal today. Continue to follow this for now.

## 2023-10-05 NOTE — Assessment & Plan Note (Signed)
I will check lipids today. Continue atorvastatin 20 mg daily.

## 2023-10-05 NOTE — Assessment & Plan Note (Signed)
I will refer back to [facility] for monitoring of recurrent stones.

## 2023-10-05 NOTE — Progress Notes (Signed)
[facility] [address] [place] [address] Dept: [phone] Dept Fax: [phone]  Chronic Care Office Visit  Subjective:    Patient ID: Donald Lang, male    DOB: 1970-11-27, 52 y.o..   MRN: [id number]  Chief Complaint  Patient presents with   Hyperlipidemia    F/u chol. Not fasting today.  C/o having LT heel pain x 8 months. No OTC meds taken and has been using arch supports.    History of Present Illness:  Patient is in today for reassessment of chronic medical issues.  Mr. Gress has a history of hypertension. He is not currently on medication and we continue to monitor this.  Mr. Schons has a history of hyperlipidemia. He is currently managed on atorvastatin 20 mg daily (increased at his last visit). He was seen at [facility] on 11/09/2022 with chest pain. His evaluation did not find an acute cardiac event. He followed up with cardiology this summer and did have a normal echocardiogram.   Mr. Nazzal notes that he has had a past history of plantar fasciitis. The last time this occurred, it improved after 6 months. He has currently been having left heel pain for the past 8 months. He has tried plantar fascial stretches, icing, and orthotic use, but with out significant improvement.   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   Attention-deficit hyperactivity disorder, unspecified type 11/28/2022   Male erectile disorder 05/11/2018   Major depressive disorder, single episode, moderate (HCC) 05/11/2018   Personal history of traumatic brain injury 05/11/2018    History of urinary stone 03/06/2017   Wedge compression fracture of T7-t8 vertebra, sequela 03/06/2017   Tension-type headache, unspecified, not intractable 03/05/2017   Palpitations 02/04/2017   Sleep apnea 12/17/2016   Hyperlipidemia, unspecified 12/17/2016   Essential (primary) hypertension 12/17/2016   Allergic rhinitis, unspecified 12/17/2016   Neck pain 10/15/2016   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 10/15/2016   Past Surgical History:  Procedure Laterality Date   c 5 to C 6 disc repacement  2018   CYSTOSCOPY/URETEROSCOPY/HOLMIUM LASER/STENT PLACEMENT Left 04/11/2020   Procedure: LEFT URETEROSCOPY/HOLMIUM LASER STONE EXTRACTION STENT PLACEMENT;  Surgeon: Crist Fat, MD;  Location: [facility];  Service: Urology;  Laterality: Left;   EXTRACORPOREAL SHOCK WAVE LITHOTRIPSY  2005   x2 1990   Family History  Problem Relation Age of Onset   Cancer Mother        breast   Other Father        temporal arteritis   Hypertension Father    Hyperlipidemia Father    Diabetes Father    Dementia Father    Heart disease Maternal Uncle    Heart disease Maternal Uncle    Heart disease Paternal Uncle    Alzheimer's disease Maternal Grandmother    Cancer Maternal Grandmother        breast   Alzheimer's disease Maternal Grandfather    Cancer Paternal Grandmother        ovarian   Alzheimer's disease Paternal Grandfather    Outpatient Medications Prior to Visit  Medication Sig Dispense Refill  acetaminophen (TYLENOL) 500 MG tablet Take 1,000 mg by mouth every 6 (six) hours as needed.     atorvastatin (LIPITOR) 20 MG tablet Take 2 tablets (40 mg total) by mouth daily. 90 tablet 3   ibuprofen (ADVIL) 200 MG tablet Take 200 mg by mouth every 6 (six) hours as needed. Takes 800 mg     No facility-administered medications prior to visit.   No Known Allergies Objective:   Today's Vitals   10/05/23 1059  BP: 124/76  Pulse: 65  Temp: 98 F  (36.7 C)  TempSrc: Temporal  SpO2: 99%  Weight: 213 lb 9.6 oz (96.9 kg)  Height: 5\' 8"  (1.727 m)   Body mass index is 32.48 kg/m.   General: Well developed, well nourished. No acute distress. Foot: Pain on palpation over the plantar aspect of the heel. There is no tenderness of the Achilles   tendon. Psych: Alert and oriented. Normal mood and affect.  Health Maintenance Due  Topic Date Due   HIV Screening  Never done   Hepatitis C Screening  Never done   INFLUENZA VACCINE  05/07/2023     Assessment & Plan:   Problem List Items Addressed This Visit       Cardiovascular and Mediastinum   Essential (primary) hypertension   BP is normal today. Continue to follow this for now.         Musculoskeletal and Integument   Plantar fasciitis of left foot   Recommend continued focus on stretches, icing, and consider use of Aleve 220 mg bid. If not improving, may consider follow-up with podiatry.        Other   History of urinary stone   I will refer back to [facility] for monitoring of recurrent stones.      Relevant Orders   Ambulatory referral to Urology   Hyperlipidemia, unspecified   I will check lipids today. Continue atorvastatin 20 mg daily.      Relevant Orders   Lipid panel   Other Visit Diagnoses       Need for immunization against influenza    -  Primary   Relevant Orders   Flu vaccine trivalent PF, 6mos and older(Flulaval,Afluria,Fluarix,Fluzone) (Completed)       Return in about 6 months (around 04/04/2024) for Reassessment.   Loyola Mast, MD

## 2023-10-05 NOTE — Assessment & Plan Note (Signed)
Recommend continued focus on stretches, icing, and consider use of Aleve 220 mg bid. If not improving, may consider follow-up with podiatry.

## 2023-10-05 NOTE — Addendum Note (Signed)
Addended by: Loyola Mast on: 10/05/2023 04:56 PM   Modules accepted: Orders

## 2023-10-08 ENCOUNTER — Ambulatory Visit: Admitting: Family Medicine

## 2024-04-04 ENCOUNTER — Ambulatory Visit: Payer: Self-pay | Admitting: Family Medicine

## 2024-04-04 ENCOUNTER — Encounter: Payer: Self-pay | Admitting: Family Medicine

## 2024-04-04 ENCOUNTER — Ambulatory Visit: Admitting: Family Medicine

## 2024-04-04 VITALS — BP 120/78 | HR 77 | Temp 97.6°F | Ht 68.0 in | Wt 215.2 lb

## 2024-04-04 DIAGNOSIS — Z1211 Encounter for screening for malignant neoplasm of colon: Secondary | ICD-10-CM

## 2024-04-04 DIAGNOSIS — L570 Actinic keratosis: Secondary | ICD-10-CM | POA: Diagnosis not present

## 2024-04-04 DIAGNOSIS — E782 Mixed hyperlipidemia: Secondary | ICD-10-CM | POA: Diagnosis not present

## 2024-04-04 DIAGNOSIS — I10 Essential (primary) hypertension: Secondary | ICD-10-CM | POA: Diagnosis not present

## 2024-04-04 DIAGNOSIS — M722 Plantar fascial fibromatosis: Secondary | ICD-10-CM | POA: Diagnosis not present

## 2024-04-04 LAB — LIPID PANEL
Cholesterol: 231 mg/dL — ABNORMAL HIGH (ref 0–200)
HDL: 51.2 mg/dL (ref >39.00)
LDL Cholesterol: 150 mg/dL — ABNORMAL HIGH (ref 0–99)
NonHDL: 179.39
Total CHOL/HDL Ratio: 5
Triglycerides: 147 mg/dL (ref 0.0–149.0)
VLDL: 29.4 mg/dL (ref 0.0–40.0)

## 2024-04-04 NOTE — Assessment & Plan Note (Signed)
 Recommend continued focus on stretches and icing. I will refer him to podiatry to consider a steroid injection.

## 2024-04-04 NOTE — Assessment & Plan Note (Signed)
 BP is normal today. Continue to follow this for now.

## 2024-04-04 NOTE — Assessment & Plan Note (Signed)
 Lesion on face appears to be an actinic keratosis. I will refer him for dermatology assessment and possible cryotherapy.

## 2024-04-04 NOTE — Progress Notes (Signed)
 [facility] [address] [place] [address] Dept: [phone] Dept Fax: [phone]  Chronic Care Office Visit  Subjective:    Patient ID: Donald Lang, male    DOB: September 17, 1971, 53 y.o..   MRN: [id number]  Chief Complaint  Patient presents with   Hypertension    6 month F/u HTN.  Dermatology referral and new colonoscopy referral.    History of Present Illness:  Patient is in today for reassessment of chronic medical issues.  Donald Lang has a history of hypertension. He is not currently on medication and we continue to monitor this.   Donald Lang has a history of hyperlipidemia. He is currently managed on atorvastatin  40 mg daily (increased at his last visit). He notes that he has not been good at medication adherence. He misses his doses about 50% of the time.   Donald Lang notes that he has had a past history of plantar fasciitis. He has tried plantar fascial stretches, icing, and orthotic use, but with out significant improvement. This has been present for more than a year. This creates a barrier to regular exercise.  Donald Lang notes an ara of persistent scaliness over his right temple. He states both his father and brother have had skin cancer. He worries about this becoming an issue for him.  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   Attention-deficit hyperactivity disorder, unspecified type 11/28/2022   Male erectile disorder 05/11/2018   Major depressive disorder, single episode, moderate (HCC) 05/11/2018    Personal history of traumatic brain injury 05/11/2018   History of urinary stone 03/06/2017   Wedge compression fracture of T7-t8 vertebra, sequela 03/06/2017   Tension-type headache, unspecified, not intractable 03/05/2017   Palpitations 02/04/2017   Sleep apnea 12/17/2016   Hyperlipidemia, unspecified 12/17/2016   Essential (primary) hypertension 12/17/2016   Allergic rhinitis, unspecified 12/17/2016   Neck pain 10/15/2016   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 10/15/2016   Past Surgical History:  Procedure Laterality Date   c 5 to C 6 disc repacement  2018   CYSTOSCOPY/URETEROSCOPY/HOLMIUM LASER/STENT PLACEMENT Left 04/11/2020   Procedure: LEFT URETEROSCOPY/HOLMIUM LASER STONE EXTRACTION STENT PLACEMENT;  Surgeon: Cam Morene ORN, MD;  Location: [facility];  Service: Urology;  Laterality: Left;   EXTRACORPOREAL SHOCK WAVE LITHOTRIPSY  2005   x2 1990   Family History  Problem Relation Age of Onset   Cancer Mother        breast   Other Father        temporal arteritis   Hypertension Father    Hyperlipidemia Father    Diabetes Father    Dementia Father    Heart disease Maternal Uncle    Heart disease Maternal Uncle    Heart disease Paternal Uncle    Alzheimer's disease Maternal Grandmother    Cancer Maternal Grandmother        breast   Alzheimer's disease Maternal Grandfather    Cancer Paternal Grandmother        ovarian   Alzheimer's disease Paternal Grandfather    Outpatient Medications Prior to  Visit  Medication Sig Dispense Refill   acetaminophen  (TYLENOL ) 500 MG tablet Take 1,000 mg by mouth every 6 (six) hours as needed.     atorvastatin  (LIPITOR) 40 MG tablet Take 1 tablet (40 mg total) by mouth daily. 90 tablet 3   ibuprofen (ADVIL) 200 MG tablet Take 200 mg by mouth every 6 (six) hours as needed. Takes 800 mg     No facility-administered medications prior to visit.   No Known Allergies Objective:   Today's Vitals    04/04/24 0951  BP: 120/78  Pulse: 77  Temp: 97.6 F (36.4 C)  TempSrc: Temporal  SpO2: 95%  Weight: 215 lb 3.2 oz (97.6 kg)  Height: 5' 8 (1.727 m)   Body mass index is 32.72 kg/m.   General: Well developed, well nourished. No acute distress. Skin- There is a small scaly macule over the right temple, into the side burn. Psych: Alert and oriented. Normal mood and affect.  Health Maintenance Due  Topic Date Due   HIV Screening  Never done   Hepatitis C Screening  Never done   Colonoscopy  Never done     Assessment & Plan:   Problem List Items Addressed This Visit       Cardiovascular and Mediastinum   Essential (primary) hypertension - Primary   BP is normal today. Continue to follow this for now.         Musculoskeletal and Integument   Actinic keratoses   Lesion on face appears to be an actinic keratosis. I will refer him for dermatology assessment and possible cryotherapy.      Relevant Orders   Ambulatory referral to Dermatology   Plantar fasciitis of left foot   Recommend continued focus on stretches and icing. I will refer him to podiatry to consider a steroid injection.      Relevant Orders   Ambulatory referral to Podiatry     Other   Hyperlipidemia, unspecified   I will recheck lipids today. Continue atorvastatin  40 mg daily. Discussed approaches to increase medication adherence.      Relevant Orders   Lipid panel   Other Visit Diagnoses       Screening for colon cancer       Relevant Orders   Ambulatory referral to Gastroenterology       Return in about 6 months (around 10/04/2024) for Reassessment.   Garnette CHRISTELLA Simpler, MD

## 2024-04-04 NOTE — Assessment & Plan Note (Signed)
 I will recheck lipids today. Continue atorvastatin  40 mg daily. Discussed approaches to increase medication adherence.

## 2024-04-14 ENCOUNTER — Ambulatory Visit (INDEPENDENT_AMBULATORY_CARE_PROVIDER_SITE_OTHER)

## 2024-04-14 ENCOUNTER — Ambulatory Visit: Admitting: Podiatry

## 2024-04-14 VITALS — Ht 68.0 in | Wt 215.0 lb

## 2024-04-14 DIAGNOSIS — M722 Plantar fascial fibromatosis: Secondary | ICD-10-CM | POA: Diagnosis not present

## 2024-04-14 DIAGNOSIS — M7752 Other enthesopathy of left foot: Secondary | ICD-10-CM

## 2024-04-14 DIAGNOSIS — M62462 Contracture of muscle, left lower leg: Secondary | ICD-10-CM

## 2024-04-14 MED ORDER — MELOXICAM 15 MG PO TABS
15.0000 mg | ORAL_TABLET | Freq: Every day | ORAL | 3 refills | Status: DC
Start: 2024-04-14 — End: 2024-08-23

## 2024-04-14 NOTE — Patient Instructions (Signed)
 VISIT SUMMARY: Today, you were seen for heel pain that has been troubling you since April of last year. The pain is associated with plantar fasciitis, a condition where the tissue connecting your heel bone to your toes becomes inflamed. We discussed your symptoms, reviewed your treatment history, and developed a plan to help alleviate your pain and improve your mobility.  YOUR PLAN: -PLANTAR FASCIITIS: Plantar fasciitis is an inflammation of the tissue that connects your heel bone to your toes, often caused by overuse or improper footwear. We administered a corticosteroid injection to your left heel to reduce inflammation and prescribed meloxicam  to be taken once daily for one month. You should follow a home exercise plan focusing on calf stretching and use a night splint to stretch your calf muscle overnight. If there is no improvement in one month, we may refer you to formal physical therapy. Future treatments could include platelet injections or shockwave therapy if needed.  -GASTROCNEMIUS EQUINUS: Gastrocnemius equinus is a condition where the calf muscles are tight, which can contribute to plantar fasciitis by increasing tension on the plantar fascia. To address this, you should incorporate calf stretching exercises into your routine and use a night splint to help stretch your calf muscle overnight. Surgery to lengthen the calf muscle is rarely needed.  INSTRUCTIONS: Please follow up in one month if there is no improvement in your symptoms. At that time, we may consider referring you to formal physical therapy.                      Contains text generated by [employer].               Plantar Fasciitis (Heel Spur Syndrome) with Rehab The plantar fascia is a fibrous, ligament-like, soft-tissue structure that spans the bottom of the foot. Plantar fasciitis is a condition that causes pain in the foot due to inflammation of the tissue. SYMPTOMS  Pain and tenderness on  the underneath side of the foot. Pain that worsens with standing or walking. CAUSES  Plantar fasciitis is caused by irritation and injury to the plantar fascia on the underneath side of the foot. Common mechanisms of injury include: Direct trauma to bottom of the foot. Damage to a small nerve that runs under the foot where the main fascia attaches to the heel bone. Stress placed on the plantar fascia due to bone spurs. RISK INCREASES WITH:  Activities that place stress on the plantar fascia (running, jumping, pivoting, or cutting). Poor strength and flexibility. Improperly fitted shoes. Tight calf muscles. Flat feet. Failure to warm-up properly before activity. Obesity. PREVENTION Warm up and stretch properly before activity. Allow for adequate recovery between workouts. Maintain physical fitness: Strength, flexibility, and endurance. Cardiovascular fitness. Maintain a health body weight. Avoid stress on the plantar fascia. Wear properly fitted shoes, including arch supports for individuals who have flat feet.  PROGNOSIS  If treated properly, then the symptoms of plantar fasciitis usually resolve without surgery. However, occasionally surgery is necessary.  RELATED COMPLICATIONS  Recurrent symptoms that may result in a chronic condition. Problems of the lower back that are caused by compensating for the injury, such as limping. Pain or weakness of the foot during push-off following surgery. Chronic inflammation, scarring, and partial or complete fascia tear, occurring more often from repeated injections.  TREATMENT  Treatment initially involves the use of ice and medication to help reduce pain and inflammation. The use of strengthening and stretching exercises may help reduce pain with activity,  especially stretches of the Achilles tendon. These exercises may be performed at home or with a therapist. Your caregiver may recommend that you use heel cups of arch supports to help  reduce stress on the plantar fascia. Occasionally, corticosteroid injections are given to reduce inflammation. If symptoms persist for greater than 6 months despite non-surgical (conservative), then surgery may be recommended.   MEDICATION  If pain medication is necessary, then nonsteroidal anti-inflammatory medications, such as aspirin  and ibuprofen, or other minor pain relievers, such as acetaminophen , are often recommended. Do not take pain medication within 7 days before surgery. Prescription pain relievers may be given if deemed necessary by your caregiver. Use only as directed and only as much as you need. Corticosteroid injections may be given by your caregiver. These injections should be reserved for the most serious cases, because they may only be given a certain number of times.  HEAT AND COLD Cold treatment (icing) relieves pain and reduces inflammation. Cold treatment should be applied for 10 to 15 minutes every 2 to 3 hours for inflammation and pain and immediately after any activity that aggravates your symptoms. Use ice packs or massage the area with a piece of ice (ice massage). Heat treatment may be used prior to performing the stretching and strengthening activities prescribed by your caregiver, physical therapist, or athletic trainer. Use a heat pack or soak the injury in warm water.  SEEK IMMEDIATE MEDICAL CARE IF: Treatment seems to offer no benefit, or the condition worsens. Any medications produce adverse side effects.  EXERCISES- RANGE OF MOTION (ROM) AND STRETCHING EXERCISES - Plantar Fasciitis (Heel Spur Syndrome) These exercises may help you when beginning to rehabilitate your injury. Your symptoms may resolve with or without further involvement from your physician, physical therapist or athletic trainer. While completing these exercises, remember:  Restoring tissue flexibility helps normal motion to return to the joints. This allows healthier, less painful movement and  activity. An effective stretch should be held for at least 30 seconds. A stretch should never be painful. You should only feel a gentle lengthening or release in the stretched tissue.  RANGE OF MOTION - Toe Extension, Flexion Sit with your right / left leg crossed over your opposite knee. Grasp your toes and gently pull them back toward the top of your foot. You should feel a stretch on the bottom of your toes and/or foot. Hold this stretch for 10 seconds. Now, gently pull your toes toward the bottom of your foot. You should feel a stretch on the top of your toes and or foot. Hold this stretch for 10 seconds. Repeat  times. Complete this stretch 3 times per day.   RANGE OF MOTION - Ankle Dorsiflexion, Active Assisted Remove shoes and sit on a chair that is preferably not on a carpeted surface. Place right / left foot under knee. Extend your opposite leg for support. Keeping your heel down, slide your right / left foot back toward the chair until you feel a stretch at your ankle or calf. If you do not feel a stretch, slide your bottom forward to the edge of the chair, while still keeping your heel down. Hold this stretch for 10 seconds. Repeat 3 times. Complete this stretch 2 times per day.   STRETCH  Gastroc, Standing Place hands on wall. Extend right / left leg, keeping the front knee somewhat bent. Slightly point your toes inward on your back foot. Keeping your right / left heel on the floor and your knee straight, shift  your weight toward the wall, not allowing your back to arch. You should feel a gentle stretch in the right / left calf. Hold this position for 10 seconds. Repeat 3 times. Complete this stretch 2 times per day.  STRETCH  Soleus, Standing Place hands on wall. Extend right / left leg, keeping the other knee somewhat bent. Slightly point your toes inward on your back foot. Keep your right / left heel on the floor, bend your back knee, and slightly shift your weight over  the back leg so that you feel a gentle stretch deep in your back calf. Hold this position for 10 seconds. Repeat 3 times. Complete this stretch 2 times per day.  STRETCH  Gastrocsoleus, Standing  Note: This exercise can place a lot of stress on your foot and ankle. Please complete this exercise only if specifically instructed by your caregiver.  Place the ball of your right / left foot on a step, keeping your other foot firmly on the same step. Hold on to the wall or a rail for balance. Slowly lift your other foot, allowing your body weight to press your heel down over the edge of the step. You should feel a stretch in your right / left calf. Hold this position for 10 seconds. Repeat this exercise with a slight bend in your right / left knee. Repeat 3 times. Complete this stretch 2 times per day.   STRENGTHENING EXERCISES - Plantar Fasciitis (Heel Spur Syndrome)  These exercises may help you when beginning to rehabilitate your injury. They may resolve your symptoms with or without further involvement from your physician, physical therapist or athletic trainer. While completing these exercises, remember:  Muscles can gain both the endurance and the strength needed for everyday activities through controlled exercises. Complete these exercises as instructed by your physician, physical therapist or athletic trainer. Progress the resistance and repetitions only as guided.  STRENGTH - Towel Curls Sit in a chair positioned on a non-carpeted surface. Place your foot on a towel, keeping your heel on the floor. Pull the towel toward your heel by only curling your toes. Keep your heel on the floor. Repeat 3 times. Complete this exercise 2 times per day.  STRENGTH - Ankle Inversion Secure one end of a rubber exercise band/tubing to a fixed object (table, pole). Loop the other end around your foot just before your toes. Place your fists between your knees. This will focus your strengthening at your  ankle. Slowly, pull your big toe up and in, making sure the band/tubing is positioned to resist the entire motion. Hold this position for 10 seconds. Have your muscles resist the band/tubing as it slowly pulls your foot back to the starting position. Repeat 3 times. Complete this exercises 2 times per day.  Document Released: 09/22/2005 Document Revised: 12/15/2011 Document Reviewed: 01/04/2009 [facility] Patient Information 2014 [place], [place].

## 2024-04-17 NOTE — Progress Notes (Signed)
 Subjective:  Patient ID: Donald Lang, male    DOB: 1971-08-16,  MRN: [id number]  Chief Complaint  Patient presents with   Foot Pain    RM 9 Patient is here for possible plantar fasciitis of the left foot. Pain states presented 15 months ago. Patient has tried insoles, cold therapy, massage, otc pain medication.    Discussed the use of AI scribe software for clinical note transcription with the patient, who gave verbal consent to proceed.  History of Present Illness Donald Lang is a 53 year old male who presents with heel pain.  He has been experiencing heel pain since April of last year, which he associates with a day spent power washing his pool deck while wearing flip flops. The pain varies in intensity, sometimes not noticeable and other times severe enough to make walking difficult.  He has tried various treatments including ibuprofen, icing, and stretching exercises. Initially, he took ibuprofen consistently for about three months but has since stopped due to a preference to avoid daily medication. He also uses a racket ball and ice bottle for rolling his foot and performs exercises like 'the alphabet' with his foot when experiencing pain at night.  He is retired from [employer] but remains active, working on converting a historic barn into [employer], which involves a lot of walking. He acknowledges being overweight and wants to return to working out, though the heel pain limits his ability to engage in physical activities.  He has a history of receiving steroid injections in other areas such as his wrists, neck, and thoracic spine, which did not provide much relief.      Objective:    Physical Exam VASCULAR: DP and PT pulse palpable. Foot is warm and well-perfused. Capillary fill time is brisk. DERMATOLOGIC: Normal skin turgor, texture, and temperature. No open lesions, rashes, or ulcerations. NEUROLOGIC: Normal sensation to light touch and pressure. No  paresthesias. ORTHOPEDIC: Pain on palpation to the plantar heel. Insertion of the plantar fascia. Moderate to severe gastrocnemius equinus. Smooth pain-free range of motion of all examined joints. No ecchymosis or bruising. No gross deformity.       Results Procedure: Corticosteroid injection Description: The plantar medial left heel was injected with twenty milligrams of Kenalog , four milligrams of dexamethasone , and five milligrams of Marcaine and lidocaine . The area was dressed with a Band-Aid.  RADIOLOGY Heel X-ray: Mild calcaneal spurring posterior and inferior. No fracture or stress fracture. (04/14/2024)   Assessment:   1. Plantar fasciitis of left foot   2. Gastrocnemius equinus of left lower extremity      Plan:  Patient was evaluated and treated and all questions answered.  Assessment and Plan Assessment & Plan Plantar fasciitis Chronic plantar fasciitis with inflammation at the attachment of the plantar fascia to the heel, likely exacerbated by overuse and improper footwear. Symptoms have been present since April of last year, with varying degrees of pain. Radiographs show mild calcaneal spurring. Chronic inflammation due to overuse, similar to tennis elbow, with the body not recognizing it as a new injury. - Administer corticosteroid injection to the plantar medial left heel with Kenalog , dexamethasone , Marcaine, and lidocaine . - Prescribe meloxicam  to be taken once daily for one month. - Provide a home exercise plan focusing on calf stretching. - Dispense a night splint to be worn at night to stretch the calf muscle and reduce his equinus contracture and the impact on the soft tissues of the plantar arch. - Advise to follow up  in one month if no improvement for potential referral to formal physical therapy. - Discuss potential future treatments such as platelet injections or shockwave therapy if initial treatments are ineffective.  Gastrocnemius equinus Moderate to  severe gastrocnemius equinus contributing to plantar fasciitis. Tight calf muscles and hamstrings exacerbate the condition by increasing tension on the plantar fascia. Surgery to lengthen the calf muscle is rarely needed. - Incorporate calf stretching exercises into the home exercise plan. - Use a night splint to aid in stretching the calf muscle overnight.      No follow-ups on file.

## 2024-05-20 ENCOUNTER — Encounter: Payer: Self-pay | Admitting: Internal Medicine

## 2024-06-09 ENCOUNTER — Ambulatory Visit (INDEPENDENT_AMBULATORY_CARE_PROVIDER_SITE_OTHER): Admitting: Podiatry

## 2024-06-09 VITALS — Ht 68.0 in | Wt 215.0 lb

## 2024-06-09 DIAGNOSIS — S93492S Sprain of other ligament of left ankle, sequela: Secondary | ICD-10-CM

## 2024-06-09 NOTE — Patient Instructions (Signed)
 Chronic Ankle Instability & Rehab Chronic Ankle Instability Chronic ankle instability is a condition that makes the ankle weak and more likely to give way. The condition is common among athletes, especially those with prior ankle ligament injury. Ligaments are strong tissues that connectbones to each other. What are the causes?  This condition is caused by multiple ankle sprains that have not healedproperly, leaving the ankle ligaments loose or damaged. What increases the risk? This condition is more likely to develop in people who participate in sports in which there is a risk of spraining an ankle. These sports include: Cross-country trail running. Basketball. Baseball. Tennis. Football. Soccer. What are the signs or symptoms? Symptoms of this condition include: Rolling your ankle repeatedly. Swelling. Pain. Bruising. Tenderness. Feeling wobbly or unsteady on your foot. Difficulty walking on uneven surfaces or in the dark. How is this diagnosed? This condition may be diagnosed based on: Your symptoms. Your medical history. A physical exam. Your health care provider will check your balance, strength, and range of motion. He or she will also check your injured ankle against your healthy ankle. Imaging tests, such as: An X-ray. A CT scan. An MRI. An ultrasound. How is this treated? Treatment for this condition may include: Wearing a removable boot, brace, or splint. Wearing supportive shoes or shoe inserts. Applying ice to the ankle to reduce swelling. Taking anti-inflammatory pain medicine. Doing exercises (physical therapy). Not putting any body weight, or putting only limited body weight, on your ankle for several days. Gradually returning to full activity. Surgery to repair damaged ligaments. Usually, surgery is needed only if the condition is severe or if othertreatments do not work. Follow these instructions at home: If you have a boot, brace, or splint: Wear it  as told by your health care provider. Remove it only as told by your health care provider. Loosen it if your toes tingle, become numb, or turn cold and blue. Keep it clean. If it is not waterproof: Do not let it get wet. Cover it with a watertight covering when you take a bath or a shower. Ask your health care provider when it is safe to drive with a boot, brace, or splint on your foot. Managing pain, stiffness, and swelling  If directed, put ice on the injured area. If you have a removable boot, brace, or splint, remove it as told by your health care provider. Put ice in a plastic bag. Place a towel between your skin and the bag. Leave the ice on for 20 minutes, 2-3 times a day. Move your toes, foot, and ankle often to reduce stiffness and swelling. Raise (elevate) the injured area above the level of your heart while you are sitting or lying down.  Activity Return to your normal activities as told by your health care provider. Ask your health care provider what activities are safe for you. Do not put your full body weight on your ankle until your health care provider says that you can. Do not do any activities that make pain or swelling worse. Do exercises as told by your health care provider. General instructions Take over-the-counter and prescription medicines only as told by your health care provider. Wear supportive shoes or inserts as told by your health care provider. Keep all follow-up visits as told by your health care provider. This is important. How is this prevented? Wear supportive footwear that is appropriate for your athletic activity. Avoid athletic activities that cause pain or swelling in your ankle. See your health  care provider if you have an ankle sprain that causes pain and swelling for more than 2-4 weeks. Do ankle range-of-motion and strengthening exercises as told by your health care provider before beginning any athletic activity. If you start a new athletic  activity, start gradually to build up your strength and flexibility. Contact a health care provider if: Your condition is not getting better after 2-4 weeks of treatment. You cannot put body weight on your ankle without feeling more pain. Summary Chronic ankle instability is a condition that makes the ankle weak and more likely to give way. This condition is caused by multiple ankle sprains that have not healed properly, leaving the ankle ligaments loose or damaged. Treatment includes wearing a boot, brace, or splint, taking medicines for pain and inflammation, and using ice on the affected area. Follow your health care provider's instructions for caring for your ankle during recovery. Contact your health care provider if your ankle does not get better in 2-4 weeks, or if you cannot put weight on your ankle without feeling more pain. This information is not intended to replace advice given to you by your health care provider. Make sure you discuss any questions you have with your healthcare provider. Document Revised: 07/06/2018 Document Reviewed: 07/06/2018 Elsevier Patient Education  2022 [employer]    Ask your health care provider which exercises are safe for you. Do exercises exactly as told by your health care provider and adjust them as directed. It is normal to feel mild stretching, pulling, tightness, or discomfort as you do these exercises. Stop right away if you feel sudden pain or your pain gets worse. Do not begin these exercises until told by your health care provider. Strengthening exercises These exercises build strength and endurance in your ankle. Endurance is theability to use your muscles for a long time, even after they get tired. Ankle eversion Sit on the floor with your legs straight out in front of you. Loop a rubber exercise band around the ball of your left / right foot. The ball of your foot is on the walking surface, right under your toes. Hold the ends of the band  in your hands, or secure the band to a stable object. Slowly push your foot outward, away from your other leg (eversion). Hold this position for 10 seconds. Slowly return your foot to the starting position. Repeat 10 times. Complete this exercise 2 times a day. Heel walking  This exercise strengthens the muscles that push the ankle backward to point your toes toward your knee (ankle dorsiflexors). Walk on your heels for 10 ft. Keep your toes as high as possible. Repeat 10 times. Complete this exercise 2  times per day. Toe walking  This exercise strengthens the muscles that push the ankle forward and your toes downward (ankle plantar flexors). Walk on your toes for 10 ft. Keep your heels as high as possible. Repeat 10  times. Complete this exercise 2  times per day. Balance exercises These exercises improve or maintain your balance. Balance is important inimproving ankle stability and preventing falls. Tandem walking Do this exercise in a hallway or room that is at least 10 ft (3 m) long. Stand with one foot directly in front of the other (tandem). You can use the walls to help you balance if needed, but try not to use them for support. Slowly lift your back foot and place it directly in front of your other foot. Continue to walk in this heel-to-toe way for 10  ft. Repeat 10  times. Complete this exercise 2  times a day. Single leg stand Without shoes, stand near a railing or in a doorway. You may hold on to the railing or door frame as needed for balance. Stand on your left / right foot. Keep your big toe down on the floor and try to keep your arch lifted. If this is too easy, you can try doing it while you do one of these actions: Keep your eyes closed. Stand on a pillow. Throw a ball against a wall and catch it when it returns. Hold this position for 10 seconds. Repeat 10 times. Complete this exercise 2 times a day. Ankle inversion and ankle eversion This exercise is also called  foot rotation with a balance board. It uses a balance board to rotate the foot and ankle inward (inversion) and outward (eversion). Ask your health care provider where you can get a balance board or how you can make one. Stand on a non-carpeted surface near a countertop or wall. Step onto the balance board so your feet are hip width apart. Keep your feet in place, and keep your upper body and hips steady. Using only your feet and ankles to move the board, do the following exercises: Tip the board from side to side as far as you can, alternating between tipping to the left and tipping to the right. Tip the board so it silently taps the floor. Do not let the board forcefully hit the floor. From time to time, pause to hold a steady midway position, with neither the right nor the left sides touching the ground. Tip the board side to side so the board does not hit the floor at all. From time to time, pause to hold a steady midway position. Repeat 10 times, pausing from time to time to hold a steady position.Complete this exercise 2 times a day. Ankle plantar flexion and ankle dorsiflexion This exercise is also called foot flexion with a balance board. It uses a balance board to push the foot downward and away from the leg (plantar flexion) or upward and toward the leg (dorsiflexion). Ask your health care provider where you can get a balance board or how you can make one. Stand on a non-carpeted surface near a countertop or wall. Step onto the balance board so your feet are hip width apart. Keep your feet in place, and keep your upper body and hips steady. Using only your feet and ankles, do the following exercises: Tip the board forward and backward so the board silently taps the floor. Do not let the board forcefully hit the floor. From time to time, pause to hold a steady position midway between touching the floor in front and touching the floor in back. Tip the board forward and backward so the board  does not hit the floor at all. From time to time, pause to hold a steady position. Repeat 10 times, pausing from time to time to hold a steady position.Complete this exercise 2 times a day. This information is not intended to replace advice given to you by your health care provider. Make sure you discuss any questions you have with your healthcare provider. Document Revised: 01/13/2019 Document Reviewed: 07/06/2018 Elsevier Patient Education  2022 [employer]   Ankle Sprain   An ankle sprain is a stretch or tear in one of the tough tissues (ligaments) that connect the bones in your ankle. An ankle sprain can happen when the ankle rolls outward (inversion sprain)  or inward (eversion sprain). What are the causes? This condition is caused by rolling or twisting the ankle. What increases the risk? You are more likely to develop this condition if you play sports. What are the signs or symptoms? Symptoms of this condition include: Pain in your ankle. Swelling. Bruising. This may happen right after you sprain your ankle or 1-2 days later. Trouble standing or walking. How is this diagnosed? This condition is diagnosed with: A physical exam. During the exam, your doctor will press on certain parts of your foot and ankle and try to move them in certain ways. X-ray imaging. These may be taken to see how bad the sprain is and to check for broken bones. How is this treated? This condition may be treated with: A brace or splint. This is used to keep the ankle from moving until it heals. An elastic bandage. This is used to support the ankle. Crutches. Pain medicine. Surgery. This may be needed if the sprain is very bad. Physical therapy. This may help to improve movement in the ankle. Follow these instructions at home: If you have a brace or a splint: Wear the brace or splint as told by your doctor. Remove it only as told by your doctor. Loosen the brace or splint if your toes: Tingle. Lose  feeling (become numb). Turn cold and blue. Keep the brace or splint clean. If the brace or splint is not waterproof: Do not let it get wet. Cover it with a watertight covering when you take a bath or a shower. If you have an elastic bandage (dressing): Remove it to shower or bathe. Try not to move your ankle much, but wiggle your toes from time to time. This helps to prevent swelling. Adjust the dressing if it feels too tight. Loosen the dressing if your foot: Loses feeling. Tingles. Becomes cold and blue. Managing pain, stiffness, and swelling   Take over-the-counter and prescription medicines only as told by doctor. For 2-3 days, keep your ankle raised (elevated) above the level of your heart. If told, put ice on the injured area: If you have a removable brace or splint, remove it as told by your doctor. Put ice in a plastic bag. Place a towel between your skin and the bag. Leave the ice on for 20 minutes, 2-3 times a day. General instructions Rest your ankle. Do not use your injured leg to support your body weight until your doctor says that you can. Use crutches as told by your doctor. Do not use any products that contain nicotine or tobacco, such as cigarettes, e-cigarettes, and chewing tobacco. If you need help quitting, ask your doctor. Keep all follow-up visits as told by your doctor. Contact a doctor if: Your bruises or swelling are quickly getting worse. Your pain does not get better after you take medicine. Get help right away if: You cannot feel your toes or foot. Your foot or toes look blue. You have very bad pain that gets worse. Summary An ankle sprain is a stretch or tear in one of the tough tissues (ligaments) that connect the bones in your ankle. This condition is caused by rolling or twisting the ankle. Symptoms include pain, swelling, bruising, and trouble walking. To help with pain and swelling, put ice on the injured ankle, raise your ankle above the  level of your heart, and use an elastic bandage. Also, rest as told by your doctor. Keep all follow-up visits as told by your doctor. This is important. This  information is not intended to replace advice given to you by your health care provider. Make sure you discuss any questions you have with your health care provider. Document Revised: 02/16/2018 Document Reviewed: 02/16/2018 Elsevier Patient Education  2020 [employer]  Ankle Sprain, Phase I Rehab An ankle sprain is an injury to the ligaments of your ankle. Ankle sprains cause stiffness, loss of motion, and loss of strength. Ask your health care provider which exercises are safe for you. Do exercises exactly as told by your health care provider and adjust them as directed. It is normal to feel mild stretching, pulling, tightness, or discomfort as you do these exercises. Stop right away if you feel sudden pain or your pain gets worse. Do not begin these exercises until told by your health care provider. Stretching and range-of-motion exercises These exercises warm up your muscles and joints and improve the movement and flexibility of your lower leg and ankle. These exercises also help to relieve pain and stiffness. Gastroc and soleus stretch  This exercise is also called a calf stretch. It stretches the muscles in the back of the lower leg. These muscles are the gastrocnemius, or gastroc, and the soleus. Sit on the floor with your left / right leg extended. Loop a belt or towel around the ball of your left / right foot. The ball of your foot is on the walking surface, right under your toes. Keep your left / right ankle and foot relaxed and keep your knee straight while you use the belt or towel to pull your foot toward you. You should feel a gentle stretch behind your calf or knee in your gastroc muscle. Hold this position for 10 seconds, then release to the starting position. Repeat the exercise with your knee bent. You can put a pillow or a  rolled bath towel under your knee to support it. You should feel a stretch deep in your calf in the soleus muscle or at your Achilles tendon. Repeat 10 times. Complete this exercise 2 times a day. Ankle alphabet   Sit with your left / right leg supported at the lower leg. Do not rest your foot on anything. Make sure your foot has room to move freely. Think of your left / right foot as a paintbrush. Move your foot to trace each letter of the alphabet in the air. Keep your hip and knee still while you trace. Make the letters as large as you can without feeling discomfort. Trace every letter from A to Z. Repeat 10 times. Complete this exercise 2 times a day. Strengthening exercises These exercises build strength and endurance in your ankle and lower leg. Endurance is the ability to use your muscles for a long time, even after they get tired. Ankle dorsiflexion   Secure a rubber exercise band or tube to an object, such as a table leg, that will stay still when the band is pulled. Secure the other end around your left / right foot. Sit on the floor facing the object, with your left / right leg extended. The band or tube should be slightly tense when your foot is relaxed. Slowly bring your foot toward you, bringing the top of your foot toward your shin (dorsiflexion), and pulling the band tighter. Hold this position for 10 seconds. Slowly return your foot to the starting position. Repeat 10 times. Complete this exercise 2 times a day. Ankle plantar flexion   Sit on the floor with your left / right leg extended. Loop a  rubber exercise tube or band around the ball of your left / right foot. The ball of your foot is on the walking surface, right under your toes. Hold the ends of the band or tube in your hands. The band or tube should be slightly tense when your foot is relaxed. Slowly point your foot and toes downward to tilt the top of your foot away from your shin (plantar flexion). Hold  this position for 10 seconds. Slowly return your foot to the starting position. Repeat 10 times. Complete this exercise 2 times a day. Ankle eversion Sit on the floor with your legs straight out in front of you. Loop a rubber exercise band or tube around the ball of your left / right foot. The ball of your foot is on the walking surface, right under your toes. Hold the ends of the band in your hands, or secure the band to a stable object. The band or tube should be slightly tense when your foot is relaxed. Slowly push your foot outward, away from your other leg (eversion). Hold this position for 10 seconds. Slowly return your foot to the starting position. Repeat 10 times. Complete this exercise 2 times a day. This information is not intended to replace advice given to you by your health care provider. Make sure you discuss any questions you have with your health care provider. Document Revised: 01/11/2019 Document Reviewed: 07/05/2018 Elsevier Patient Education  2020 [employer].

## 2024-06-09 NOTE — Progress Notes (Signed)
  Subjective:  Patient ID: Donald Lang, male    DOB: October 08, 1970,  MRN: [id number]  Chief Complaint  Patient presents with   Plantar Fasciitis    RM 8 Patient is here to follow-up on plantar fascitis of the left foot. Patient states  great improvement in overall pain and discomfort in the left foot. Patient states some minor pain in the heel after prolonged standing. Patient is concerned about a previous ankle injury and continued pain in the left ankle.    Discussed the use of AI scribe software for clinical note transcription with the patient, who gave verbal consent to proceed.  History of Present Illness Donald Lang is a 53 year old male who presents with heel pain.  He returns for follow-up his heel pain is doing much better he still dealing with lateral ankle pain prior to his last visit he had an ankle sprain and self treated but has not improved significantly      Objective:    Physical Exam VASCULAR: DP and PT pulse palpable. Foot is warm and well-perfused. Capillary fill time is brisk. DERMATOLOGIC: Normal skin turgor, texture, and temperature. No open lesions, rashes, or ulcerations. NEUROLOGIC: Normal sensation to light touch and pressure. No paresthesias. ORTHOPEDIC: Today no pain on palpation of the plantar heel or posterior heel he has tenderness over the ATFL with no gross instability       Results  RADIOLOGY Heel X-ray: Mild calcaneal spurring posterior and inferior. No fracture or stress fracture. (04/14/2024)   Assessment:   1. Sprain of anterior talofibular ligament of left ankle, sequela      Plan:  Patient was evaluated and treated and all questions answered.  Assessment and Plan Assessment & Plan Plantar fasciitis Has improved recommend he continue his home physical therapy plan for now  Gastrocnemius equinus Still an issue and recommend he continue to treat this with a night splint he is going to purchase 1 [employer] he returned his  previous one to us  today  We discussed treatment and sequela of lateral ankle sprains and I recommended home physical therapy plan for stabilization.  Exercises were given.  Discussed with him that if not improving or not making good progress with this in the next few weeks that I would recommend formal physical therapy and he will notify me if this happens.      Return in about 2 months (around 08/09/2024) for f/u left ankle sprain.

## 2024-06-21 ENCOUNTER — Ambulatory Visit (AMBULATORY_SURGERY_CENTER)

## 2024-06-21 VITALS — Ht 68.0 in | Wt 216.6 lb

## 2024-06-21 DIAGNOSIS — Z1211 Encounter for screening for malignant neoplasm of colon: Secondary | ICD-10-CM

## 2024-06-21 MED ORDER — PEG 3350-KCL-NA BICARB-NACL 420 G PO SOLR: 4000.0000 mL | Freq: Once | ORAL | 0 refills | Status: AC

## 2024-06-21 NOTE — Progress Notes (Signed)
 No egg or soy allergy known to patient  No issues known to pt with past sedation with any surgeries or procedures Patient denies ever being told they had issues or difficulty with intubation  No FH of Malignant Hyperthermia Pt is not on diet pills Pt is not on  home 02  Pt is not on blood thinners  Pt denies issues with constipation  No A fib or A flutter Have any cardiac testing pending-- no  LOA: independent  Prep: Golytely   Patient's chart reviewed by Norleen Schillings CNRA prior to previsit and patient appropriate for the [facility].  Previsit completed and red dot placed by patient's name on their procedure day (on provider's schedule).     PV completed with patient. Prep instructions sent via mychart and home address.

## 2024-07-04 NOTE — Progress Notes (Unsigned)
 [facility] History and Physical   Primary Care Physician:  Thedora Garnette HERO, MD   Reason for Procedure:    Encounter Diagnosis  Name Primary?   Special screening for malignant neoplasms, colon Yes     Plan:    colonoscopy     HPI: Donald Lang is a 53 y.o. male here for a screening colonoscopy   Past Medical History:  Diagnosis Date   Headache    tension gheadaches   High cholesterol    History of kidney stones    Hypertension    no current bp meds   Sleep apnea    cpap    Past Surgical History:  Procedure Laterality Date   c 5 to C 6 disc repacement  2018   CYSTOSCOPY/URETEROSCOPY/HOLMIUM LASER/STENT PLACEMENT Left 04/11/2020   Procedure: LEFT URETEROSCOPY/HOLMIUM LASER STONE EXTRACTION STENT PLACEMENT;  Surgeon: Cam Morene ORN, MD;  Location: [facility];  Service: Urology;  Laterality: Left;   EXTRACORPOREAL SHOCK WAVE LITHOTRIPSY  2005   x2 1990     Current Outpatient Medications  Medication Sig Dispense Refill   acetaminophen  (TYLENOL ) 500 MG tablet Take 1,000 mg by mouth every 6 (six) hours as needed.     atorvastatin  (LIPITOR) 40 MG tablet Take 1 tablet (40 mg total) by mouth daily. 90 tablet 3   ibuprofen (ADVIL) 200 MG tablet Take 200 mg by mouth every 6 (six) hours as needed. Takes 800 mg     meloxicam  (MOBIC ) 15 MG tablet Take 1 tablet (15 mg total) by mouth daily. 30 tablet 3   No current facility-administered medications for this visit.    Allergies as of 07/05/2024   (No Known Allergies)    Family History  Problem Relation Age of Onset   Cancer Mother        breast   Crohn's disease Father    Other Father        temporal arteritis   Hypertension Father    Hyperlipidemia Father    Diabetes Father    Dementia Father    Heart disease Maternal Uncle    Heart disease Maternal Uncle    Heart disease Paternal Uncle    Alzheimer's disease Maternal Grandmother    Cancer Maternal Grandmother        breast    Alzheimer's disease Maternal Grandfather    Cancer Paternal Grandmother        ovarian   Alzheimer's disease Paternal Grandfather    Stomach cancer Neg Hx    Rectal cancer Neg Hx    Colon cancer Neg Hx     Social History   Socioeconomic History   Marital status: Married    Spouse name: Not on file   Number of children: 2   Years of education: Not on file   Highest education level: Associate degree: academic program  Occupational History   Occupation: Retired Cabin crew  Tobacco Use   Smoking status: Never   Smokeless tobacco: Never  Vaping Use   Vaping status: Never Used  Substance and Sexual Activity   Alcohol use: Yes    Comment: occ   Drug use: Not Currently   Sexual activity: Yes  Other Topics Concern   Not on file  Social History Narrative   Not on file   Social Drivers of Health   Financial Resource Strain: Low Risk  (04/03/2024)   Overall Financial Resource Strain (CARDIA)    Difficulty of Paying Living Expenses: Not very hard  Food Insecurity: No Food  Insecurity (04/03/2024)   Hunger Vital Sign    Worried About Running Out of Food in the Last Year: Never true    Ran Out of Food in the Last Year: Never true  Transportation Needs: No Transportation Needs (04/03/2024)   PRAPARE - Administrator, Civil Service (Medical): No    Lack of Transportation (Non-Medical): No  Physical Activity: Sufficiently Active (04/03/2024)   Exercise Vital Sign    Days of Exercise per Week: 3 days    Minutes of Exercise per Session: 60 min  Stress: Stress Concern Present (04/03/2024)   [employer] of Occupational Health - Occupational Stress Questionnaire    Feeling of Stress: To some extent  Social Connections: Moderately Isolated (04/03/2024)   Social Connection and Isolation Panel    Frequency of Communication with Friends and Family: Twice a week    Frequency of Social Gatherings with Friends and Family: Once a week    Attends Religious Services: Never    Automotive engineer or Organizations: No    Attends Engineer, structural: Not on file    Marital Status: Married  Catering manager Violence: Not on file    Review of Systems: Positive for *** All other review of systems negative except as mentioned in the HPI.  Physical Exam: Vital signs There were no vitals taken for this visit.  General:   Alert,  Well-developed, well-nourished, pleasant and cooperative in NAD Lungs:  Clear throughout to auscultation.   Heart:  Regular rate and rhythm; no murmurs, clicks, rubs,  or gallops. Abdomen:  Soft, nontender and nondistended. Normal bowel sounds.   Neuro/Psych:  Alert and cooperative. Normal mood and affect. A and O x 3   @Jerie Basford  CHARLENA Commander, MD, [facility] [phone] (pager) 07/04/2024 8:00 PM@

## 2024-07-05 ENCOUNTER — Ambulatory Visit: Admitting: Internal Medicine

## 2024-07-05 ENCOUNTER — Encounter: Payer: Self-pay | Admitting: Internal Medicine

## 2024-07-05 VITALS — BP 129/71 | HR 66 | Temp 98.2°F | Resp 12 | Ht 68.0 in | Wt 216.6 lb

## 2024-07-05 DIAGNOSIS — D122 Benign neoplasm of ascending colon: Secondary | ICD-10-CM | POA: Diagnosis not present

## 2024-07-05 DIAGNOSIS — Z860100 Personal history of colon polyps, unspecified: Secondary | ICD-10-CM | POA: Diagnosis not present

## 2024-07-05 DIAGNOSIS — K635 Polyp of colon: Secondary | ICD-10-CM | POA: Diagnosis not present

## 2024-07-05 DIAGNOSIS — Z1211 Encounter for screening for malignant neoplasm of colon: Secondary | ICD-10-CM

## 2024-07-05 MED ORDER — SODIUM CHLORIDE 0.9 % IV SOLN: 500.0000 mL | Freq: Once | INTRAVENOUS | Status: DC

## 2024-07-05 NOTE — Progress Notes (Signed)
 Vss nad trans to pacu

## 2024-07-05 NOTE — Patient Instructions (Addendum)
 I found and removed 2 polyps - small and look benign. I will let you know pathology results and when to have another routine colonoscopy by mail and/or My Chart.  Otherwise normal.  I appreciate the opportunity to care for you. Donald CHARLENA Commander, MD, [facility]  Handouts given: Polyps Resume previous diet. Continue present medications.  Repeat colonoscopy is recommended. The colonoscopy date will be determined after pathology results from today's exam become available for review.  YOU HAD AN ENDOSCOPIC PROCEDURE TODAY AT THE [facility]:   Refer to the procedure report that was given to you for any specific questions about what was found during the examination.  If the procedure report does not answer your questions, please call your gastroenterologist to clarify.  If you requested that your care partner not be given the details of your procedure findings, then the procedure report has been included in a sealed envelope for you to review at your convenience later.  YOU SHOULD EXPECT: Some feelings of bloating in the abdomen. Passage of more gas than usual.  Walking can help get rid of the air that was put into your GI tract during the procedure and reduce the bloating. If you had a lower endoscopy (such as a colonoscopy or flexible sigmoidoscopy) you may notice spotting of blood in your stool or on the toilet paper. If you underwent a bowel prep for your procedure, you may not have a normal bowel movement for a few days.  Please Note:  You might notice some irritation and congestion in your nose or some drainage.  This is from the oxygen used during your procedure.  There is no need for concern and it should clear up in a day or so.  SYMPTOMS TO REPORT IMMEDIATELY:  Following lower endoscopy (colonoscopy or flexible sigmoidoscopy):  Excessive amounts of blood in the stool  Significant tenderness or worsening of abdominal pains  Swelling of the abdomen that is new, acute  Fever of 100F  or higher  For urgent or emergent issues, a gastroenterologist can be reached at any hour by calling [phone]. Do not use MyChart messaging for urgent concerns.    DIET:  We do recommend a small meal at first, but then you may proceed to your regular diet.  Drink plenty of fluids but you should avoid alcoholic beverages for 24 hours.  ACTIVITY:  You should plan to take it easy for the rest of today and you should NOT DRIVE or use heavy machinery until tomorrow (because of the sedation medicines used during the test).    FOLLOW UP: Our staff will call the number listed on your records the next business day following your procedure.  We will call around 7:15- 8:00 am to check on you and address any questions or concerns that you may have regarding the information given to you following your procedure. If we do not reach you, we will leave a message.     If any biopsies were taken you will be contacted by phone or by letter within the next 1-3 weeks.  Please call us  at [phone] if you have not heard about the biopsies in 3 weeks.    SIGNATURES/CONFIDENTIALITY: You and/or your care partner have signed paperwork which will be entered into your electronic medical record.  These signatures attest to the fact that that the information above on your After Visit Summary has been reviewed and is understood.  Full responsibility of the confidentiality of this discharge information lies with  you and/or your care-partner.

## 2024-07-05 NOTE — Progress Notes (Signed)
 Called to room to assist during endoscopic procedure.  Patient ID and intended procedure confirmed with present staff. Received instructions for my participation in the procedure from the performing physician.

## 2024-07-05 NOTE — Progress Notes (Signed)
Pt. states no medical or surgical changes since previsit or office visit. 

## 2024-07-05 NOTE — Op Note (Signed)
 [facility] Patient Name: Donald Lang Procedure Date: 07/05/2024 7:55 AM MRN: [id number] Endoscopist: Lupita FORBES Commander , MD, [id number] Age: 53 Referring MD:  Date of Birth: September 27, 1971 Gender: Male Account #: [id number] Procedure:                Colonoscopy Indications:              High risk colon cancer surveillance: Personal                            history of colonic polyps Medicines:                Monitored Anesthesia Care Procedure:                Pre-Anesthesia Assessment:                           - Prior to the procedure, a History and Physical                            was performed, and patient medications and                            allergies were reviewed. The patient's tolerance of                            previous anesthesia was also reviewed. The risks                            and benefits of the procedure and the sedation                            options and risks were discussed with the patient.                            All questions were answered, and informed consent                            was obtained. Prior Anticoagulants: The patient has                            taken no anticoagulant or antiplatelet agents. ASA                            Grade Assessment: III - A patient with severe                            systemic disease. After reviewing the risks and                            benefits, the patient was deemed in satisfactory                            condition to undergo the procedure.  After obtaining informed consent, the colonoscope                            was passed under direct vision. Throughout the                            procedure, the patient's blood pressure, pulse, and                            oxygen saturations were monitored continuously. The                            CF [id number] #[id number] was introduced through the anus                            and advanced to the the cecum,  identified by                            appendiceal orifice and ileocecal valve. The                            colonoscopy was performed without difficulty. The                            patient tolerated the procedure well. The quality                            of the bowel preparation was good. The ileocecal                            valve, appendiceal orifice, and rectum were                            photographed. The bowel preparation used was SUPREP                            via split dose instruction. Scope In: 8:15:24 AM Scope Out: 8:33:29 AM Scope Withdrawal Time: 0 hours 13 minutes 13 seconds  Total Procedure Duration: 0 hours 18 minutes 5 seconds  Findings:                 The perianal and digital rectal examinations were                            normal.                           Two sessile polyps were found in the distal                            ascending colon. The polyps were 2 to 6 mm in size.                            These polyps were removed with a cold snare.  Resection and retrieval were complete. Verification                            of patient identification for the specimen was                            done. Estimated blood loss was minimal.                           The exam was otherwise without abnormality on                            direct and retroflexion views. Complications:            No immediate complications. Estimated Blood Loss:     Estimated blood loss: none. Impression:               - Two 2 to 6 mm polyps in the distal ascending                            colon, removed with a cold snare. Resected and                            retrieved.                           - The examination was otherwise normal on direct                            and retroflexion views. There was a ruptured cyst                            on left buttock - patient reports recurrent issue x                            years and  says it will internittently open and                            drain bloody fluid.                           - Personal history of colonic polyps. reports                            colonoscopy and polypectomy 5 years ago (in [employer])                            and told to follow-up 3 years. Recommendation:           - Patient has a contact number available for                            emergencies. The signs and symptoms of potential  delayed complications were discussed with the                            patient. Return to normal activities tomorrow.                            Written discharge instructions were provided to the                            patient.                           - Resume previous diet.                           - Continue present medications.                           - Repeat colonoscopy is recommended. The                            colonoscopy date will be determined after pathology                            results from today's exam become available for                            review.                           - He could see surgery if desired about the cust if                            it becomes more of a problem Lupita FORBES Commander, MD 07/05/2024 8:45:18 AM This report has been signed electronically.

## 2024-07-06 ENCOUNTER — Telehealth: Payer: Self-pay

## 2024-07-06 NOTE — Telephone Encounter (Signed)
 Left message on follow up call.

## 2024-07-08 LAB — SURGICAL PATHOLOGY

## 2024-07-10 ENCOUNTER — Ambulatory Visit: Payer: Self-pay | Admitting: Internal Medicine

## 2024-07-10 DIAGNOSIS — Z860100 Personal history of colon polyps, unspecified: Secondary | ICD-10-CM

## 2024-08-09 ENCOUNTER — Ambulatory Visit (INDEPENDENT_AMBULATORY_CARE_PROVIDER_SITE_OTHER): Payer: Self-pay | Admitting: Podiatry

## 2024-08-09 DIAGNOSIS — Z91199 Patient's noncompliance with other medical treatment and regimen due to unspecified reason: Secondary | ICD-10-CM

## 2024-08-11 NOTE — Progress Notes (Signed)
 Patient was no-show for appointment today

## 2024-08-23 ENCOUNTER — Other Ambulatory Visit: Payer: Self-pay | Admitting: Lab

## 2024-08-23 MED ORDER — MELOXICAM 15 MG PO TABS
15.0000 mg | ORAL_TABLET | Freq: Every day | ORAL | 3 refills | Status: AC
Start: 2024-08-23 — End: ?

## 2024-10-04 ENCOUNTER — Ambulatory Visit: Admitting: Family Medicine

## 2024-10-04 ENCOUNTER — Encounter: Payer: Self-pay | Admitting: Family Medicine

## 2024-10-04 ENCOUNTER — Ambulatory Visit: Payer: Self-pay | Admitting: Family Medicine

## 2024-10-04 VITALS — BP 124/78 | HR 93 | Temp 98.2°F | Ht 68.0 in | Wt 220.4 lb

## 2024-10-04 DIAGNOSIS — Z23 Encounter for immunization: Secondary | ICD-10-CM | POA: Diagnosis not present

## 2024-10-04 DIAGNOSIS — Z87442 Personal history of urinary calculi: Secondary | ICD-10-CM

## 2024-10-04 DIAGNOSIS — E782 Mixed hyperlipidemia: Secondary | ICD-10-CM | POA: Diagnosis not present

## 2024-10-04 DIAGNOSIS — J3089 Other allergic rhinitis: Secondary | ICD-10-CM | POA: Diagnosis not present

## 2024-10-04 DIAGNOSIS — L729 Follicular cyst of the skin and subcutaneous tissue, unspecified: Secondary | ICD-10-CM | POA: Diagnosis not present

## 2024-10-04 DIAGNOSIS — Z1159 Encounter for screening for other viral diseases: Secondary | ICD-10-CM

## 2024-10-04 DIAGNOSIS — I10 Essential (primary) hypertension: Secondary | ICD-10-CM | POA: Diagnosis not present

## 2024-10-04 LAB — LIPID PANEL
Cholesterol: 161 mg/dL (ref 28–200)
HDL: 46.8 mg/dL
LDL Cholesterol: 87 mg/dL (ref 10–99)
NonHDL: 114.55
Total CHOL/HDL Ratio: 3
Triglycerides: 136 mg/dL (ref 10.0–149.0)
VLDL: 27.2 mg/dL (ref 0.0–40.0)

## 2024-10-04 MED ORDER — FLUTICASONE PROPIONATE 50 MCG/ACT NA SUSP: 2.0000 | Freq: Every day | NASAL | 6 refills | Status: AC

## 2024-10-04 MED ORDER — CETIRIZINE HCL 10 MG PO TABS: 10.0000 mg | ORAL_TABLET | Freq: Every day | ORAL | 11 refills | Status: AC

## 2024-10-04 NOTE — Assessment & Plan Note (Signed)
 I will recheck lipids today. Continue atorvastatin  40 mg daily.

## 2024-10-04 NOTE — Assessment & Plan Note (Signed)
 Recommend referral to a general surgeon for cyst excision. Continue Sitz baths to promote drainage for managing acute flares.

## 2024-10-04 NOTE — Assessment & Plan Note (Signed)
 I will refer to urology for ongoing monitoring of recurrent stones.

## 2024-10-04 NOTE — Assessment & Plan Note (Signed)
 Recommend use of a nasal steroid spray and a non-sedating antihistamine. I will prescribed Flonase and Zyrtec. If not improving, consider ENT referral to assess nasal septal deviation.

## 2024-10-04 NOTE — Assessment & Plan Note (Addendum)
 BP is in excellent control. We will continue to manage with lifestyle approaches.

## 2024-10-04 NOTE — Progress Notes (Signed)
 " [facility] [address] [place] [address] Dept: [phone] Dept Fax: [phone]  Chronic Care Office Visit  Subjective:    Patient ID: Donald Lang, male    DOB: [birth year], 53 y.o..   MRN: [id number]  Chief Complaint  Patient presents with   Hypertension    6 month f/u HTN.  No concerns.  Fasting today.    History of Present Illness:  Patient is in today for reassessment of chronic medical conditions.   Donald Lang has a history of hypertension. He is not currently on medication and we continue to monitor this.   Donald Lang has a history of hyperlipidemia. He is currently managed on atorvastatin  40 mg daily. He notes that he has been more consistent at taking his medication.  Donald Lang has a history of having had multiple prior kidney stones. He states int he past he was seeing a urologist every 6 months. He has not been doing this since his move. He doesn't believe he has had a recent stone.  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 He notes this periodically gets inflamed, then ruptures. Currently, he is having some tenderness and has recently had some mild drainage. He was advised to have this excised, but has not seen a surgeon yet for this.  Donald Lang has a history of chronic rhinitis. He notes he has a deviated septum, which seems to play a role in this. He was previously using a nasal steroid spray, but stopped this after a time. he regularly has rhinorrhea and PND.   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   Attention-deficit hyperactivity disorder, unspecified type 11/28/2022   Male erectile disorder 05/11/2018   Major depressive disorder, single episode, moderate (HCC) 05/11/2018   Personal history of traumatic brain injury 05/11/2018   History of urinary stone 03/06/2017   Wedge compression fracture of T7-t8 vertebra, sequela 03/06/2017   Tension-type headache, unspecified, not intractable 03/05/2017   Palpitations 02/04/2017   Sleep apnea 12/17/2016   Hyperlipidemia, unspecified 12/17/2016   Essential (primary) hypertension 12/17/2016   Allergic rhinitis, unspecified 12/17/2016   Neck pain 10/15/2016   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 10/15/2016   Past Surgical History:  Procedure Laterality Date   c 5 to C 6 disc repacement  2018   CYSTOSCOPY/URETEROSCOPY/HOLMIUM LASER/STENT PLACEMENT Left 04/11/2020   Procedure: LEFT URETEROSCOPY/HOLMIUM LASER STONE EXTRACTION STENT PLACEMENT;  Surgeon: Cam Morene ORN, MD;  Location: [facility];  Service: Urology;  Laterality: Left;   EXTRACORPOREAL SHOCK WAVE LITHOTRIPSY  2005   x2 1990   Family History  Problem Relation Age of Onset   Cancer Mother        breast   Crohn's disease Father    Other Father        temporal arteritis   Hypertension Father    Hyperlipidemia Father    Diabetes Father    Dementia Father    Heart disease Maternal Uncle    Heart disease Maternal Uncle    Heart disease  Paternal Uncle    Alzheimer's disease Maternal Grandmother    Cancer Maternal Grandmother        breast   Alzheimer's disease Maternal Grandfather    Cancer Paternal Grandmother        ovarian   Alzheimer's disease Paternal Grandfather    Stomach cancer Neg Hx    Rectal cancer Neg Hx    Colon cancer Neg Hx    Esophageal cancer Neg Hx    Outpatient Medications Prior to Visit  Medication Sig Dispense Refill   acetaminophen  (TYLENOL ) 500 MG tablet Take 1,000 mg  by mouth every 6 (six) hours as needed.     atorvastatin  (LIPITOR) 40 MG tablet Take 1 tablet (40 mg total) by mouth daily. 90 tablet 3   ibuprofen (ADVIL) 200 MG tablet Take 200 mg by mouth every 6 (six) hours as needed. Takes 800 mg     meloxicam  (MOBIC ) 15 MG tablet Take 1 tablet (15 mg total) by mouth daily. 30 tablet 3   No facility-administered medications prior to visit.   Allergies[1]   Objective:   Today's Vitals   10/04/24 1032  BP: 124/78  Pulse: 93  Temp: 98.2 F (36.8 C)  TempSrc: Temporal  SpO2: 95%  Weight: 220 lb 6.4 oz (100 kg)  Height: 5' 8 (1.727 m)   Body mass index is 33.51 kg/m.   General: Well developed, well nourished. No acute distress. HEENT: Normocephalic, non-traumatic. Nose  with mild congestion and rhinorrhea. No pain on percussion over the sinuses. There is a   right nasal septal deviation. Mucous membranes moist. Oropharynx clear. Good dentition. Buttocks: There is an area of thickened skin with redness near the right side of the gluteal cleft. There is +/- minor drainage. Psych: Alert and oriented. Normal mood and affect.  Health Maintenance Due  Topic Date Due   HIV Screening  Never done   Hepatitis C Screening  Never done   Pneumococcal Vaccine: 50+ Years (1 of 1 - PCV) Never done   Influenza Vaccine  05/06/2024   COVID-19 Vaccine (4 - 2025-26 season) 06/06/2024   Lab Results Lipid Panel:  Lab Results  Component Value Date   CHOL 231 (H) 04/04/2024   HDL 51.20 04/04/2024   LDLCALC 150 (H) 04/04/2024   TRIG 147.0 04/04/2024     Assessment & Plan:   Problem List Items Addressed This Visit       Cardiovascular and Mediastinum   Essential (primary) hypertension - Primary   BP is in excellent control. We will continue to manage with lifestyle approaches.        Respiratory   Allergic rhinitis, unspecified   Recommend use of a nasal steroid spray and a non-sedating antihistamine. I will prescribed Flonase and Zyrtec. If not  improving, consider ENT referral to assess nasal septal deviation.      Relevant Medications   fluticasone (FLONASE) 50 MCG/ACT nasal spray   cetirizine (ZYRTEC) 10 MG tablet     Other   Cyst of buttocks   Recommend referral to a general surgeon for cyst excision. Continue Sitz baths to promote drainage for managing acute flares.      Relevant Orders   Ambulatory referral to General Surgery   History of urinary stone   I will refer to urology for ongoing monitoring of recurrent stones.      Relevant Orders   Ambulatory referral to Urology   Hyperlipidemia, unspecified   I will recheck lipids today. Continue atorvastatin  40 mg daily.  Relevant Orders   Lipid panel (Completed)   Other Visit Diagnoses       Need for immunization against influenza       Relevant Orders   Flu vaccine trivalent PF, 6mos and older(Flulaval,Afluria,Fluarix,Fluzone) (Completed)     Encounter for hepatitis C screening test for low risk patient       Relevant Orders   HCV Ab w Reflex to Quant PCR     Need for pneumococcal 20-valent conjugate vaccination       Relevant Orders   Pneumococcal conjugate vaccine 20-valent (Completed)       Return in about 6 months (around 04/04/2025) for Reassessment.   Garnette CHRISTELLA Simpler, MD  I,Emily Lagle,acting as a scribe for Garnette CHRISTELLA Simpler, MD.,have documented all relevant documentation on the behalf of Garnette CHRISTELLA Simpler, MD.  I, Garnette CHRISTELLA Simpler, MD, have reviewed all documentation for this visit. The documentation on 10/04/2024 for the exam, diagnosis, procedures, and orders are all accurate and complete.     [1] No Known Allergies  "

## 2024-10-05 LAB — HCV AB W REFLEX TO QUANT PCR: HCV Ab: NONREACTIVE

## 2024-10-05 LAB — HCV INTERPRETATION

## 2024-10-11 ENCOUNTER — Other Ambulatory Visit: Payer: Self-pay | Admitting: Family Medicine

## 2024-10-11 DIAGNOSIS — E782 Mixed hyperlipidemia: Secondary | ICD-10-CM

## 2024-10-14 ENCOUNTER — Ambulatory Visit: Payer: Self-pay | Admitting: General Surgery

## 2024-11-16 ENCOUNTER — Ambulatory Visit: Admitting: Physician Assistant

## 2024-12-16 ENCOUNTER — Ambulatory Visit (HOSPITAL_COMMUNITY): Admit: 2024-12-16 | Admitting: General Surgery

## 2024-12-16 ENCOUNTER — Encounter (HOSPITAL_BASED_OUTPATIENT_CLINIC_OR_DEPARTMENT_OTHER): Payer: Self-pay

## 2025-04-04 ENCOUNTER — Ambulatory Visit: Admitting: Family Medicine
